# Patient Record
Sex: Female | Born: 1985 | Race: White | Hispanic: No | Marital: Single | Smoking: Never smoker
Health system: Southern US, Community
[De-identification: ages and names within clinical notes are randomized; demographics above are authoritative.]

## PROBLEM LIST (undated history)

## (undated) ENCOUNTER — Inpatient Hospital Stay (HOSPITAL_COMMUNITY): Payer: Self-pay

## (undated) DIAGNOSIS — K219 Gastro-esophageal reflux disease without esophagitis: Secondary | ICD-10-CM

## (undated) DIAGNOSIS — K259 Gastric ulcer, unspecified as acute or chronic, without hemorrhage or perforation: Secondary | ICD-10-CM

## (undated) HISTORY — PX: CYST REMOVAL TRUNK: SHX6283

---
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
## 2005-05-13 ENCOUNTER — Emergency Department (HOSPITAL_COMMUNITY): Admission: EM | Admit: 2005-05-13 | Discharge: 2005-05-14 | Payer: Self-pay | Admitting: Emergency Medicine

## 2006-12-26 IMAGING — CR DG FINGER THUMB 2+V*R*
3 series · 3 of 3 positions shown · non-contrast
Comparison: none

CLINICAL DATA: Infected right thumb. 
 RIGHT THUMB - 3 VIEWS: 
 Three views of the right thumb show diffuse soft tissue swelling.  No fracture is seen and no erosive bony change is evident.  Alignment is normal.

[x finger pa right]
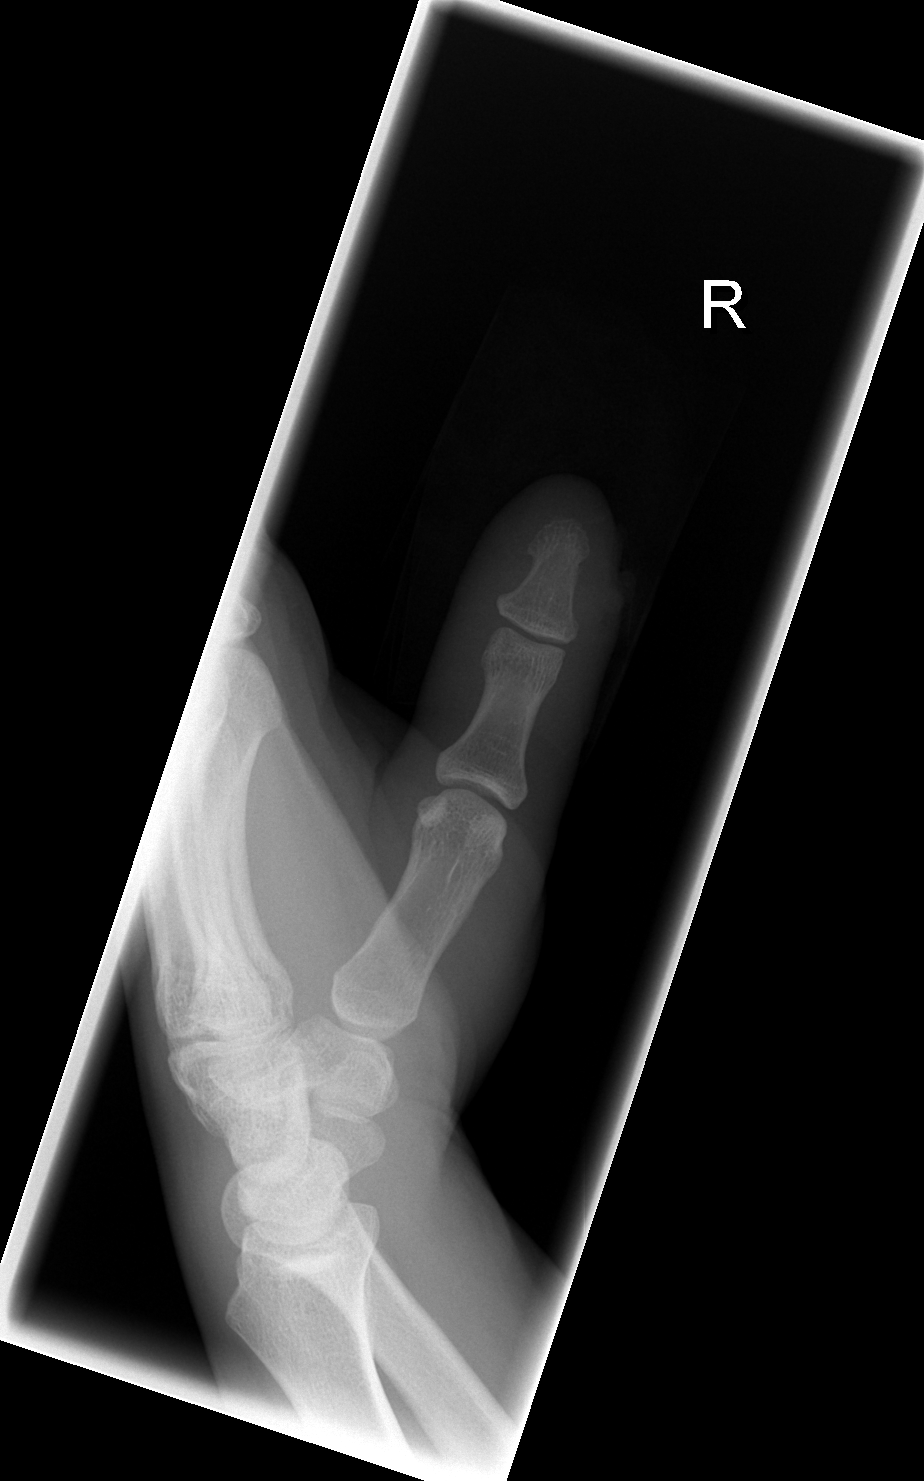

[x finger obl. right]
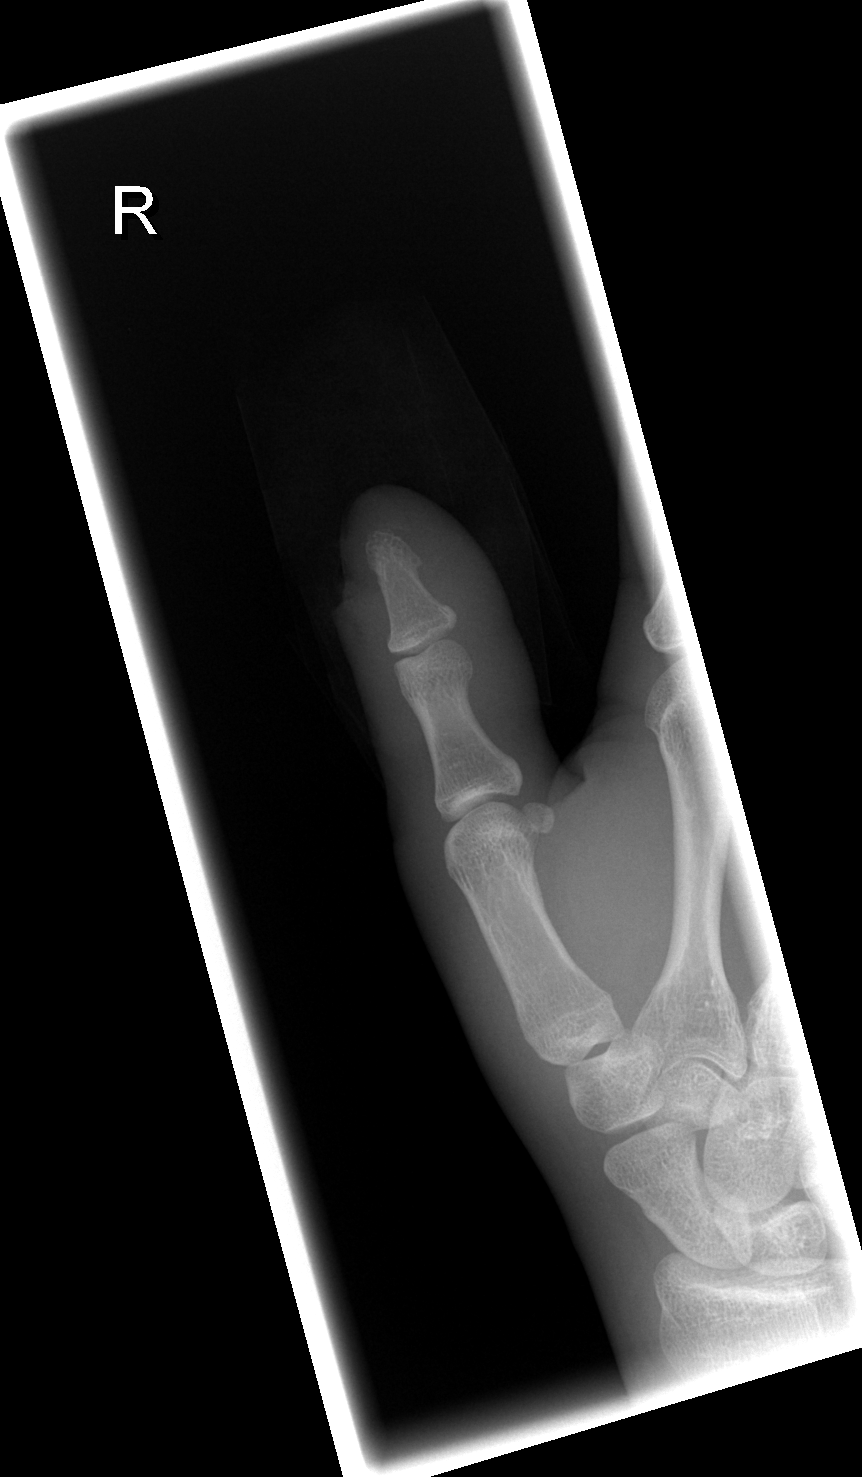

[x finger lateral right]
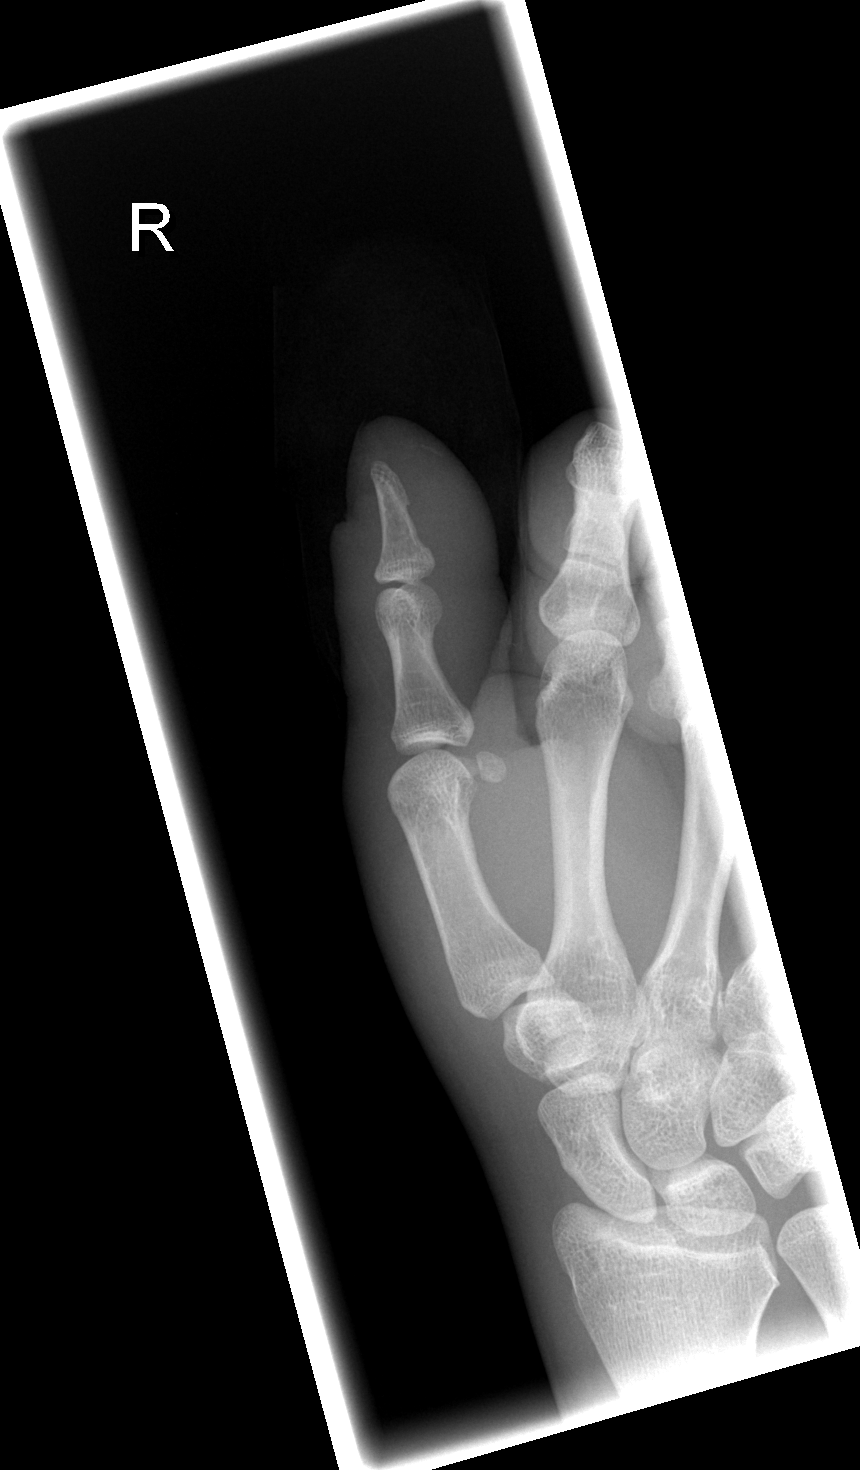

[3 of 3 positions shown; findings below may reference images not displayed]

IMPRESSION: No acute bony abnormality.  Diffuse soft tissue swelling.

## 2007-05-12 ENCOUNTER — Emergency Department (HOSPITAL_COMMUNITY): Admission: EM | Admit: 2007-05-12 | Discharge: 2007-05-12 | Payer: Self-pay | Admitting: Emergency Medicine

## 2008-12-24 IMAGING — CR DG HAND COMPLETE 3+V*R*
3 series · 3 of 3 positions shown · non-contrast
Comparison: None.

CLINICAL DATA: Laceration, pain. 
 RIGHT HAND ? 3 VIEW:

[x hand pa right]
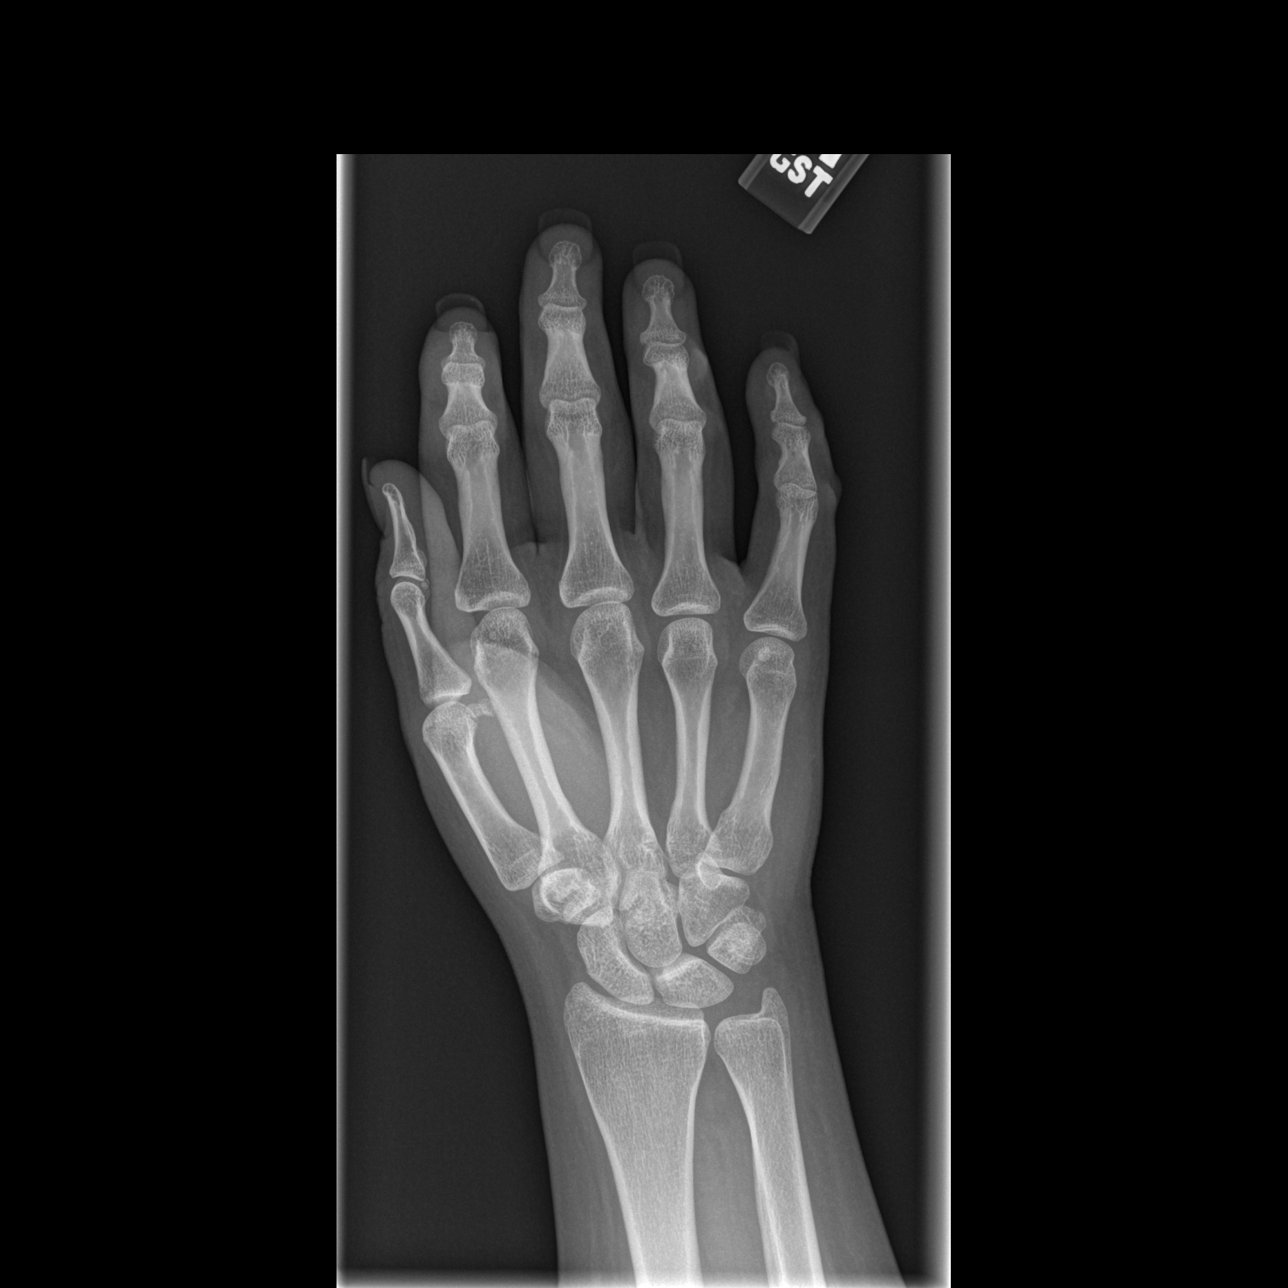

[x hand oblique right]
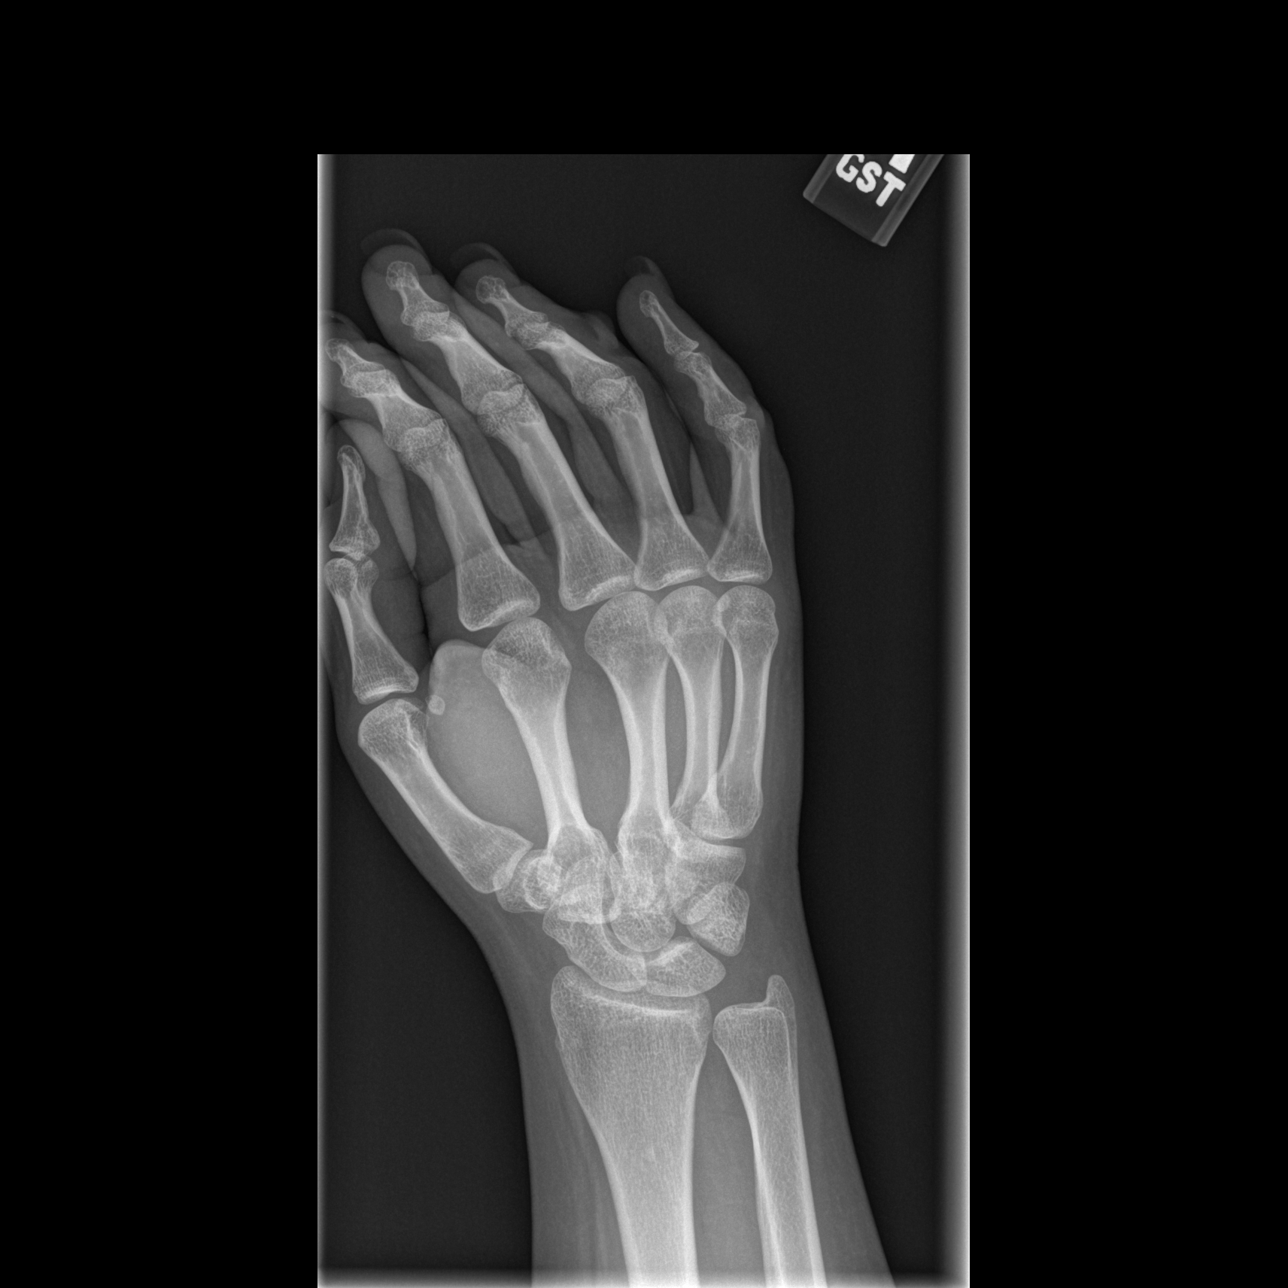

[x hand lat right]
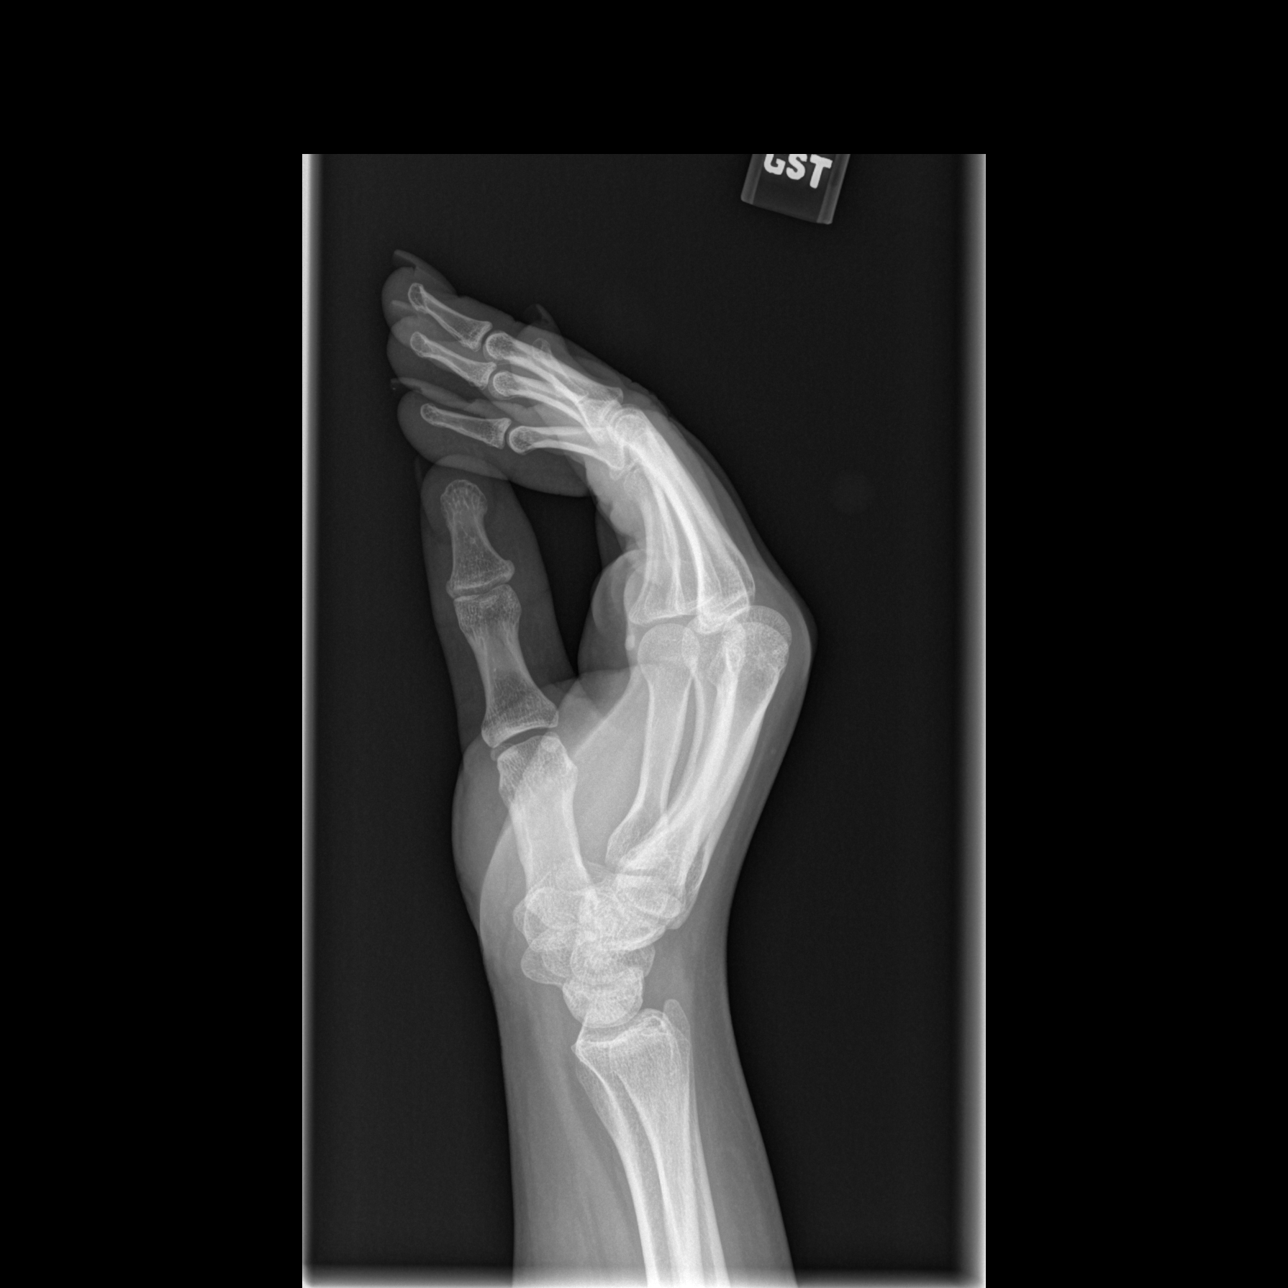

[3 of 3 positions shown; findings below may reference images not displayed]

FINDINGS: Soft tissue defects are seen at the proximal DIP joint of the ring finger and PIP joint of the little finger, compatible with laceration.  No foreign body.  No fracture or dislocation.
IMPRESSION: Laceration of ring and little fingers without fracture or foreign body.

## 2010-08-25 NOTE — Procedures (Signed)
[facility]  Patient:    Wendy Blevins, Wendy Blevins Visit Number: [id number] MRN: [id number]          Service Type: Attending:  Verlin Grills, M.D. Dictated by:   Verlin Grills, M.D. Proc. Date: 04/26/01   CC:         Anastasio Auerbach, M.D.   Procedure Report  PROCEDURE:  Esophagogastroduodenoscopy.  REFERRING PHYSICIAN:  Anastasio Auerbach, M.D.  INDICATIONS FOR PROCEDURE:  The patient is a 25 year old female who is admitted to the hospital having accidentally ingested hy-speed which is a caustic cleaner.  ENDOSCOPIST:  Verlin Grills, M.D.  PREMEDICATION:  Versed 10 mg and Demerol 50 mg.  ENDOSCOPE:  Olympus gastroscope.  DESCRIPTION OF PROCEDURE:  After discussing the procedure with the patient, she was placed in the left lateral decubitus position.  I administered intravenous Demerol and intravenous Versed to achieve conscious sedation for the procedure.  The patients blood pressure, oxygen saturation, and cardiac rhythm were monitored throughout the procedure, and documented in the medical record.  The Olympus gastroscope was easily passed through the posterior hypopharynx into the proximal esophagus without difficulty.  The hypopharynx, larynx, and vocal cords appeared normal.  Esophagoscopy:  The proximal and mid segments of the esophagus appeared normal.  There is circumferential erosive esophagitis, localized to the esophagogastric junction.  Gastroscopy:  There is generalized caustic injury to the entire stomach lining; the most severe injury involves the gastric body and antrum.  There is generalized mucosal erythema, and edema with generalized ulceration involving the proximal antrum.  The pylorus appeared normal.  Duodenoscopy:  The duodenal bulb and descending duodenum appears normal.  ASSESSMENT:  Generalized gastric mucosal injury secondary to accidental ingestion of hy-speed caustic cleanser.  There is generalized mucosal  erythema indicative of generalized mucosal erosion with mucosal edema and ulceration of the proximal gastric antrum. Dictated by:   Verlin Grills, M.D. Attending:  Verlin Grills, M.D. DD:  04/26/01 TD:  04/28/01 Job: [id number]

## 2010-08-25 NOTE — H&P (Signed)
[facility]  Patient:    Wendy Blevins, Wendy Blevins Visit Number: [id number] MRN: [id number]          Service Type: MED Location: [id number] Attending Physician:  Anastasio Auerbach Dictated by:   Rosanne Sack, M.D. Admit Date:  04/24/2001   CC:         Daine Floras, M.D.   History and Physical  DATE OF BIRTH:  February 21, 1986  PROBLEM LIST: 1. Questionable ingestion of cleaning product while under alcohol    intoxication, known suicidal situation. 2. Alcohol intoxication. 3. Hypokalemia. 4. Depression.  CHIEF COMPLAINT:  Questionable ingestion of cleaning product and alcohol intoxication.  HISTORY OF PRESENT ILLNESS:  Wendy Blevins is a very pleasant 25 year old female brought by her friends to the emergency department.  The patient was drinking vodka for fun with her friends this past night when the patient was found with a bottle of Hy-Speed opened.  It was unclear if the patient tried to put some of this cleaning product in her mouth.  The patient, at this point denies any ingestion of this product.  About two days ago, Wendy Blevins moved out of her parents house after having a family argument.  The patient was with her boyfriend.  She has chronic depression, though this problem has been quite stable.  The patient denies any suicidal ideation.  She denies insomnia, decreased appetite, or decreased social interest.  Currently, Wendy Blevins denies chest pain, mouth soreness, or burning sensation.  No abdominal symptoms.  On arrival to the emergency department, the emergency room physician contacted the poison control, who recommended IV fluids and milk of magnesia.  No activated charcoal was given.  The patient denies chest pain, shortness of breath, nausea, vomiting, diarrhea, constipation, headache, syncope, cough, urinary symptoms, melena, tarry stools, bright red blood per rectum, rhinorrhea, focal weakness, swallowing, or lower extremity swelling.   No vaginal discharge.  Her last menstrual period was two weeks ago.  PAST MEDICAL HISTORY:  As in problem list.  ALLERGIES:  None.  MEDICATIONS:  Zoloft 75 mg p.o. q.d.  SOCIAL HISTORY:  The patient is single and lives with her parents.  She does not go to school nor work.  She denies smoking or illegal drug use.  She normally does not drink, except for on sporadic occasions.  FAMILY HISTORY:  Significant for diabetes in her grandmother.  Her mother and grandmother have hypertension.  No strokes in the future.  Her grandmother had a heart attack.  The maternal grandmother had melanoma.  REVIEW OF SYSTEMS:  As in HPI.  PHYSICAL EXAMINATION:  VITAL SIGNS:  Temperature 97.4, blood pressure 131/81, initial heart rate 112, currently 82, respiratory rate 20.  HEENT:  Normocephalic, atraumatic.  Nonicteric sclerae.  Conjunctivae within the normal limits.  PERRLA, EOMI.  Funduscopic exam negative for papilledema, hemorrhages.  TMs within the normal limits.  Slight dry mucous membranes. There is no evidence of focal mucosal irritation or ulcerations.  Oropharynx is clear.  NECK:  Supple.  No JVD, no bruits, no adenopathy, no thyromegaly.  LUNGS:  Clear to auscultation bilaterally.  Without crackles, wheezes.  Fair air movement bilaterally.  CARDIAC:  Irregular rate and rhythm.  Without murmurs, rubs, or gallops. Normal S1, S2.  ABDOMEN:  Nontender, nondistended.  Bowel sounds were present.  No hepatosplenomegaly.  No rebound, no guarding, no masses, no bruits.  GENITOURINARY:  Within the normal limits.  RECTAL:  Not done.  BREASTS:  Within the normal limits.  EXTREMITIES:  No edema, clubbing, or cyanosis.  Pulses 2+ bilaterally.  NEUROLOGIC:  Alert and oriented x3.  Cranial nerves II-XII intact.  Sensorium intact.  Strength 5/5 in all extremities.  DTRs 3/5 in all extremities. Plantar reflexes downgoing bilaterally.  LABORATORY DATA:  Urine pregnancy test negative.   Negative Tylenol level. Negative aspirin level.  Urine drug screen negative.  Alcohol level 254. Sodium 139, potassium 3.3, chloride 108, CO2 24, BUN 4, creatinine 0.6, glucose 99.  LFTs within the normal limits.  Hemoglobin 14.2, MCV 81, WBC 7.0, no left shift, platelets 326.  ASSESSMENT AND PLAN: 1. Questionable ingestion of cleaning product:  The cleaning product in    question is Hy-Speed, which has hydrogen chloride, para-di-isobutyl    phenoxyethoxyethyldimithylbenzyl, and, finally, ammonia chloride.  This    chemical is quite corrosive in human tissue.  Normally associated with    severe irritation and possible ulcerations of the surface it normally gets    in contact with.  Currently, the physical exam does not support any    ingestion whatsoever of this chemical agent.  The patient denies sore    mouth, chest pain, or abdominal or epigastric upset.  She cannot remember    specifically if she ended up getting any drop of this product in the mouth    because she was under alcohol effect/intoxication.  For now, our plan is to    observe this patient for several hours in the hospital.  The patient    received milk of magnesia in the ER.  No activated charcoal was given.  At    this point, there is no evidence of suicidal ideation nor uncontrolled or    unstable depression.  Once the patient is medically clear, she will be    discharged home with her parents, and she will be followed by Dr. Betti Cruz,    her psychiatrist.  For now, will provide with supportive care with IV    fluids, milk of magnesia, and Protonix.  There is no need for inpatient    psychiatric evaluation nor GI input at this point. 2. Alcohol intoxication:  I talked to the patient in the presence of her    parents about the importance of not drinking alcohol, especially under the    age of 55.  The patients parents agree, as well as Wendy Blevins.  The patient    does not have any alcohol use or abuse. 3. Hypokalemia:  There  are slight signs of dehydration, likely to be     associated with alcohol intoxication.  IV fluids were started.  Potassium    supplementation was given.  A new serum potassium level will be checked    within six hours. 4. Depression:  This problem seems to be stable, as described in problem #1.    Will continue Zoloft, and the patient will be followed by Dr. Betti Cruz as an    outpatient. Dictated by:   Rosanne Sack, M.D. Attending Physician:  Anastasio Auerbach DD:  04/25/01 TD:  04/25/01 Job: [id number]

## 2010-08-25 NOTE — Consult Note (Signed)
[facility]  Wendy Blevins, Wendy Blevins                ACCOUNT NO.:  [id number]   MEDICAL RECORD NO.:  [id number]          PATIENT TYPE:  EMS   LOCATION:  ED                           FACILITY:  [facility]   PHYSICIAN:  Vanita Panda. Magnus Ivan, M.D.DATE OF BIRTH:  [birth year]   DATE OF CONSULTATION:  05/13/2005  DATE OF DISCHARGE:                                   CONSULTATION   REASON FOR CONSULTATION:  Right thumb complicated felon.   CONSULTING PHYSICIAN:  Tinnie Gens P. Caporossi, M.D.   HISTORY OF PRESENT ILLNESS:  Briefly, Wendy Blevins is a 25 year old known drug  user, who says she has an infection of her right thumb going on for the last  two weeks.  She said she got copper caught in this, and it sounds like it  was from a copper crack pipe.  Even in the ER tonight she is trying to sign  out AMA.  When Dr. Weldon Inches consulted me to address the infection, he said  that she had purulence coming out her thumb as well as a black eschar and  red streaks on her arm.  Upon consulting the patient, I told her she needed  to stay for at least a bedside I&D and a dose of IV antibiotics.  I believe  she was given a dose of IV Rocephin.  She told me that she has had staph  infections in the past, so I ordered a dose of 1 g IV vancomycin.   PAST MEDICAL HISTORY:  None.   ALLERGIES:  No known drug allergies.   MEDICATIONS:  None.   SOCIAL HISTORY:  She has two children, who live with her mother, and she  admits to using crack cocaine as much as two days ago.  She also has a  history of alcohol use and tobacco use.   REVIEW OF SYSTEMS:  Negative for chest pain, shortness of breath, fever or  chills, nausea or vomiting.   PHYSICAL EXAMINATION:  VITAL SIGNS:  She is afebrile with stable vital  signs.  GENERAL:  She is an alert and oriented female who is drowsy from, I believe,  obvious drug use today.  EXTREMITIES:  Examination of her right upper extremity shows a large eschar  on the radial aspect of the thumb at  the thumb tip.  There is gross  purulence coming out of this and it does appear to be a complicated felon.  She has normal sensation of her tip.  There is no evidence of flexor tendon  synovitis.  She flexes and extends the thumb easily.  She has dorsal  erythema as well.  She has palpable pulses in her wrist.  There is no  evidence of infection in the deep spaces of her palm or at her wrist.  She  does have some red streaking in her arm.   IMPRESSION:  This is a 25 year old with a complicated right thumb felon and  obvious increasing infection.   PLAN:  I was able to prep her hand with Betadine and alcohol at the bedside.  I then applied a plain 0.25%  Sensorcaine digital block without complication.  I was able to irrigate and debride the eschar at the bedside as well as open  up the thumb deeply.  I encountered gross purulence and then irrigated this  with a solution of Betadine and normal saline, followed by a full liter of  normal saline solution.  I then packed the wound with Xeroform dressing,  followed by sterile soft Kling and Coban.  The patient was given a dose of  IV vancomycin, and I am going to discharge her from the ER on oral  doxycycline as well as Vicodin.  She will follow up in my office tomorrow  for a recheck of her wound.  She was given information for following up in  my office as well.           ______________________________  Vanita Panda. Magnus Ivan, M.D.     CYB/MEDQ  D:  05/14/2005  T:  05/14/2005  Job:  [id number]   cc:   Tinnie Gens P. Weldon Inches, MD  Fax: [phone]

## 2012-10-05 ENCOUNTER — Inpatient Hospital Stay (HOSPITAL_COMMUNITY)
Admission: AD | Admit: 2012-10-05 | Discharge: 2012-10-05 | Disposition: A | Discharge status: Home / Self Care | Payer: Medicaid Other | Source: Ambulatory Visit | Attending: Obstetrics & Gynecology | Admitting: Obstetrics & Gynecology

## 2012-10-05 ENCOUNTER — Encounter (HOSPITAL_COMMUNITY): Payer: Self-pay | Admitting: *Deleted

## 2012-10-05 DIAGNOSIS — R109 Unspecified abdominal pain: Secondary | ICD-10-CM | POA: Insufficient documentation

## 2012-10-05 DIAGNOSIS — N930 Postcoital and contact bleeding: Secondary | ICD-10-CM

## 2012-10-05 DIAGNOSIS — O26859 Spotting complicating pregnancy, unspecified trimester: Secondary | ICD-10-CM | POA: Insufficient documentation

## 2012-10-05 DIAGNOSIS — N949 Unspecified condition associated with female genital organs and menstrual cycle: Secondary | ICD-10-CM | POA: Insufficient documentation

## 2012-10-05 HISTORY — DX: Gastric ulcer, unspecified as acute or chronic, without hemorrhage or perforation: K25.9

## 2012-10-05 HISTORY — DX: Gastro-esophageal reflux disease without esophagitis: K21.9

## 2012-10-05 LAB — URINALYSIS, ROUTINE W REFLEX MICROSCOPIC
Bilirubin Urine: NEGATIVE
Glucose, UA: NEGATIVE mg/dL
Hgb urine dipstick: NEGATIVE
Ketones, ur: NEGATIVE mg/dL
Leukocytes, UA: NEGATIVE
Nitrite: NEGATIVE
Protein, ur: NEGATIVE mg/dL
Specific Gravity, Urine: 1.025 (ref 1.005–1.030)
Urobilinogen, UA: 0.2 mg/dL (ref 0.0–1.0)
pH: 6 (ref 5.0–8.0)

## 2012-10-05 NOTE — MAU Provider Note (Signed)
  History     CSN: [id number]  Arrival date and time: 10/05/12 1220   First Provider Initiated Contact with Patient 10/05/12 1323      Chief Complaint  Patient presents with  . Vaginal Bleeding   HPI Wendy Blevins is a 27 y.o. G2P1001 at [redacted]w[redacted]d who presents with vaginal spotting and some intermittent crampy lower abdominal pain.  Patient reports some spotting early today/this morning.  Patient reports intercourse last night.  Bleeding is light and was noticed with wiping.  She denies contractions but reports some mild intermittent lower abdominal pain which she describes as "crampy."  No loss of fluid. + vaginal discharge but no itching, burning or odor.    Patient has had no PNC thus far.  Past Medical History  Diagnosis Date  . GERD (gastroesophageal reflux disease)   . Stomach ulcer     Past Surgical History  Procedure Laterality Date  . Cesarean section      No family history on file.  History  Substance Use Topics  . Smoking status: Never Smoker   . Smokeless tobacco: Not on file  . Alcohol Use: No    Allergies: Allergies not on file  No prescriptions prior to admission    ROS Per HPI Physical Exam   Blood pressure 133/71, pulse 72, temperature 98.4 F (36.9 C), temperature source Oral, resp. rate 18, height 5\' 4"  (1.626 m), weight 104.146 kg (229 lb 9.6 oz), last menstrual period 03/30/2012.  Physical Exam Gen: well appearing, NAD. Abd: gravid but otherwise soft, nontender to palpation Ext: no appreciable lower extremity edema bilaterally Neuro: no focal deficits. GU: genital warts noted. Pelvic exam: Cervix closed. No bleeding appreciated.  No discharge.  FHR: baseline 135, mod variability, 10x10 accels, no decels Toco: none noted   MAU Course  Procedures  MDM Physical exam (above)  Assessment and Plan  Wendy Blevins is a 27 y.o. G2P1001 at [redacted]w[redacted]d who presents with vaginal spotting and lower abdominal cramping. - No bleeding seen on pelvic  exam.  Cervix closed.  Bleeding is post coital. - No contractions noted on Toco - No PNC - will send message to clinic staff to get patient established. - Will discharge home.   [name] Other 10/05/2012, 1:23 PM   I saw and examined patient and agree with above resident note. I reviewed history, imaging, labs, and vitals. I personally reviewed the fetal heart tracing, and it is reactive. Napoleon Form, MD

## 2012-10-05 NOTE — MAU Note (Signed)
Pt reports she has not had a period since end of December. Reports she had positive pregnancy test in March. Stared having some cramping and bleeding today . Has not started prenatal care.

## 2012-10-08 ENCOUNTER — Ambulatory Visit (HOSPITAL_COMMUNITY)
Admission: RE | Admit: 2012-10-08 | Discharge: 2012-10-08 | Disposition: A | Discharge status: Home / Self Care | Payer: Medicaid Other | Source: Ambulatory Visit | Attending: Obstetrics & Gynecology | Admitting: Obstetrics & Gynecology

## 2012-10-08 DIAGNOSIS — Z3689 Encounter for other specified antenatal screening: Secondary | ICD-10-CM | POA: Insufficient documentation

## 2012-10-08 DIAGNOSIS — N930 Postcoital and contact bleeding: Secondary | ICD-10-CM

## 2012-10-23 ENCOUNTER — Encounter: Payer: Self-pay | Admitting: Obstetrics and Gynecology

## 2012-10-29 ENCOUNTER — Encounter: Payer: Self-pay | Admitting: Family Medicine

## 2012-10-29 ENCOUNTER — Ambulatory Visit (INDEPENDENT_AMBULATORY_CARE_PROVIDER_SITE_OTHER): Payer: Medicaid Other | Admitting: Family Medicine

## 2012-10-29 VITALS — BP 123/81 | Temp 97.4°F | Wt 226.7 lb

## 2012-10-29 DIAGNOSIS — O0933 Supervision of pregnancy with insufficient antenatal care, third trimester: Secondary | ICD-10-CM | POA: Insufficient documentation

## 2012-10-29 DIAGNOSIS — O99613 Diseases of the digestive system complicating pregnancy, third trimester: Secondary | ICD-10-CM

## 2012-10-29 DIAGNOSIS — O34219 Maternal care for unspecified type scar from previous cesarean delivery: Secondary | ICD-10-CM

## 2012-10-29 DIAGNOSIS — O0930 Supervision of pregnancy with insufficient antenatal care, unspecified trimester: Secondary | ICD-10-CM

## 2012-10-29 LAB — POCT URINALYSIS DIP (DEVICE)
Hgb urine dipstick: NEGATIVE
Ketones, ur: NEGATIVE mg/dL
Leukocytes, UA: NEGATIVE
Protein, ur: NEGATIVE mg/dL
Specific Gravity, Urine: <=1.005 (ref 1.005–1.030)
pH: 5.5 (ref 5.0–8.0)

## 2012-10-29 MED ORDER — FAMOTIDINE 20 MG PO TABS: 20.0000 mg | ORAL_TABLET | Freq: Two times a day (BID) | ORAL | Status: AC

## 2012-10-29 NOTE — Progress Notes (Signed)
P = 64    Pt prefers to have 1hr GTT on 7/25.

## 2012-10-29 NOTE — Patient Instructions (Addendum)
Pregnancy - Third Trimester The third trimester of pregnancy (the last 3 months) is a period of the most rapid growth for you and your baby. The baby approaches a length of 20 inches and a weight of 6 to 10 pounds. The baby is adding on fat and getting ready for life outside your body. While inside, babies have periods of sleeping and waking, sucking thumbs, and hiccuping. You can often feel small contractions of the uterus. This is false labor. It is also called Braxton-Hicks contractions. This is like a practice for labor. The usual problems in this stage of pregnancy include more difficulty breathing, swelling of the hands and feet from water retention, and having to urinate more often because of the uterus and baby pressing on your bladder.  PRENATAL EXAMS  Blood work may continue to be done during prenatal exams. These tests are done to check on your health and the probable health of your baby. Blood work is used to follow your blood levels (hemoglobin). Anemia (low hemoglobin) is common during pregnancy. Iron and vitamins are given to help prevent this. You may also continue to be checked for diabetes. Some of the past blood tests may be done again.  The size of the uterus is measured during each visit. This makes sure your baby is growing properly according to your pregnancy dates.  Your blood pressure is checked every prenatal visit. This is to make sure you are not getting toxemia.  Your urine is checked every prenatal visit for infection, diabetes, and protein.  Your weight is checked at each visit. This is done to make sure gains are happening at the suggested rate and that you and your baby are growing normally.  Sometimes, an ultrasound is performed to confirm the position and the proper growth and development of the baby. This is a test done that bounces harmless sound waves off the baby so your caregiver can more accurately determine a due date.  Discuss the type of pain medicine and  anesthesia you will have during your labor and delivery.  Discuss the possibility and anesthesia if a cesarean section might be necessary.  Inform your caregiver if there is any mental or physical violence at home. Sometimes, a specialized non-stress test, contraction stress test, and biophysical profile are done to make sure the baby is not having a problem. Checking the amniotic fluid surrounding the baby is called an amniocentesis. The amniotic fluid is removed by sticking a needle into the belly (abdomen). This is sometimes done near the end of pregnancy if an early delivery is required. In this case, it is done to help make sure the baby's lungs are mature enough for the baby to live outside of the womb. If the lungs are not mature and it is unsafe to deliver the baby, an injection of cortisone medicine is given to the mother 1 to 2 days before the delivery. This helps the baby's lungs mature and makes it safer to deliver the baby. CHANGES OCCURING IN THE THIRD TRIMESTER OF PREGNANCY Your body goes through many changes during pregnancy. They vary from person to person. Talk to your caregiver about changes you notice and are concerned about.  During the last trimester, you have probably had an increase in your appetite. It is normal to have cravings for certain foods. This varies from person to person and pregnancy to pregnancy.  You may begin to get stretch marks on your hips, abdomen, and breasts. These are normal changes in the body   during pregnancy. There are no exercises or medicines to take which prevent this change.  Constipation may be treated with a stool softener or adding bulk to your diet. Drinking lots of fluids, fiber in vegetables, fruits, and whole grains are helpful.  Exercising is also helpful. If you have been very active up until your pregnancy, most of these activities can be continued during your pregnancy. If you have been less active, it is helpful to start an exercise  program such as walking. Consult your caregiver before starting exercise programs.  Avoid all smoking, alcohol, non-prescribed drugs, herbs and "street drugs" during your pregnancy. These chemicals affect the formation and growth of the baby. Avoid chemicals throughout the pregnancy to ensure the delivery of a healthy infant.  Backache, varicose veins, and hemorrhoids may develop or get worse.  You will tire more easily in the third trimester, which is normal.  The baby's movements may be stronger and more often.  You may become short of breath easily.  Your belly button may stick out.  A yellow discharge may leak from your breasts called colostrum.  You may have a bloody mucus discharge. This usually occurs a few days to a week before labor begins. HOME CARE INSTRUCTIONS   Keep your caregiver's appointments. Follow your caregiver's instructions regarding medicine use, exercise, and diet.  During pregnancy, you are providing food for you and your baby. Continue to eat regular, well-balanced meals. Choose foods such as meat, fish, milk and other low fat dairy products, vegetables, fruits, and whole-grain breads and cereals. Your caregiver will tell you of the ideal weight gain.  A physical sexual relationship may be continued throughout pregnancy if there are no other problems such as early (premature) leaking of amniotic fluid from the membranes, vaginal bleeding, or belly (abdominal) pain.  Exercise regularly if there are no restrictions. Check with your caregiver if you are unsure of the safety of your exercises. Greater weight gain will occur in the last 2 trimesters of pregnancy. Exercising helps:  Control your weight.  Get you in shape for labor and delivery.  You lose weight after you deliver.  Rest a lot with legs elevated, or as needed for leg cramps or low back pain.  Wear a good support or jogging bra for breast tenderness during pregnancy. This may help if worn during  sleep. Pads or tissues may be used in the bra if you are leaking colostrum.  Do not use hot tubs, steam rooms, or saunas.  Wear your seat belt when driving. This protects you and your baby if you are in an accident.  Avoid raw meat, cat litter boxes and soil used by cats. These carry germs that can cause birth defects in the baby.  It is easier to leak urine during pregnancy. Tightening up and strengthening the pelvic muscles will help with this problem. You can practice stopping your urination while you are going to the bathroom. These are the same muscles you need to strengthen. It is also the muscles you would use if you were trying to stop from passing gas. You can practice tightening these muscles up 10 times a set and repeating this about 3 times per day. Once you know what muscles to tighten up, do not perform these exercises during urination. It is more likely to cause an infection by backing up the urine.  Ask for help if you have financial, counseling, or nutritional needs during pregnancy. Your caregiver will be able to offer counseling for these   needs as well as refer you for other special needs.  Make a list of emergency phone numbers and have them available.  Plan on getting help from family or friends when you go home from the hospital.  Make a trial run to the hospital.  Take prenatal classes with the father to understand, practice, and ask questions about the labor and delivery.  Prepare the baby's room or nursery.  Do not travel out of the city unless it is absolutely necessary and with the advice of your caregiver.  Wear only low or no heal shoes to have better balance and prevent falling. MEDICINES AND DRUG USE IN PREGNANCY  Take prenatal vitamins as directed. The vitamin should contain 1 milligram of folic acid. Keep all vitamins out of reach of children. Only a couple vitamins or tablets containing iron may be fatal to a baby or young child when ingested.  Avoid use  of all medicines, including herbs, over-the-counter medicines, not prescribed or suggested by your caregiver. Only take over-the-counter or prescription medicines for pain, discomfort, or fever as directed by your caregiver. Do not use aspirin, ibuprofen or naproxen unless approved by your caregiver.  Let your caregiver also know about herbs you may be using.  Alcohol is related to a number of birth defects. This includes fetal alcohol syndrome. All alcohol, in any form, should be avoided completely. Smoking will cause low birth rate and premature babies.  Illegal drugs are very harmful to the baby. They are absolutely forbidden. A baby born to an addicted mother will be addicted at birth. The baby will go through the same withdrawal an adult does. SEEK MEDICAL CARE IF: You have any concerns or worries during your pregnancy. It is better to call with your questions if you feel they cannot wait, rather than worry about them. SEEK IMMEDIATE MEDICAL CARE IF:   An unexplained oral temperature above 102 F (38.9 C) develops, or as your caregiver suggests.  You have leaking of fluid from the vagina. If leaking membranes are suspected, take your temperature and tell your caregiver of this when you call.  There is vaginal spotting, bleeding or passing clots. Tell your caregiver of the amount and how many pads are used.  You develop a bad smelling vaginal discharge with a change in the color from clear to white.  You develop vomiting that lasts more than 24 hours.  You develop chills or fever.  You develop shortness of breath.  You develop burning on urination.  You loose more than 2 pounds of weight or gain more than 2 pounds of weight or as suggested by your caregiver.  You notice sudden swelling of your face, hands, and feet or legs.  You develop belly (abdominal) pain. Round ligament discomfort is a common non-cancerous (benign) cause of abdominal pain in pregnancy. Your caregiver still  must evaluate you.  You develop a severe headache that does not go away.  You develop visual problems, blurred or double vision.  If you have not felt your baby move for more than 1 hour. If you think the baby is not moving as much as usual, eat something with sugar in it and lie down on your left side for an hour. The baby should move at least 4 to 5 times per hour. Call right away if your baby moves less than that.  You fall, are in a car accident, or any kind of trauma.  There is mental or physical violence at home. Document Released: 03/20/2001   Document Revised: 12/19/2011 Document Reviewed: 09/22/2008 [facility] Patient Information 2014 [place], [place].   Vaginal Birth After Cesarean Delivery Vaginal birth after Cesarean delivery (VBAC) is giving birth vaginally after previously delivering a baby by a cesarean. In the past, if a woman had a Cesarean delivery, all births afterwards would be done by Cesarean delivery. This is no longer true. It can be safe for the mother to try a vaginal delivery after having a Cesarean. The final decision to have a VBAC or repeat Cesarean delivery should be between the patient and her caregiver. The risks and benefits can be discussed relative to the reason for, and the type of the previous Cesarean delivery. WOMEN WHO PLAN TO HAVE A VBAC SHOULD CHECK WITH THEIR DOCTOR TO BE SURE THAT:  The previous Cesarean was done with a low transverse uterine incision (not a vertical classical incision).  The birth canal is big enough for the baby.  There were no other operations on the uterus.  They will have an electronic fetal monitor (EFM) on at all times during labor.  An operating room would be available and ready in case an emergency Cesarean is needed.  A doctor and surgical nursing staff would be available at all times during labor to be ready to do an emergency Cesarean if necessary.  An anesthesiologist would be present in case an emergency Cesarean is  needed.  The nursery is prepared and has adequate personnel and necessary equipment available to care for the baby in case of an emergency Cesarean. BENEFITS OF VBAC:  Shorter stay in the hospital.  Lower delivery, nursery and hospital costs.  Less blood loss and need for blood transfusions.  Less fever and discomfort from major surgery.  Lower risk of blood clots.  Lower risk of infection.  Shorter recovery after going home.  Lower risk of other surgical complications, such as opening of the incision or hernia in the incision.  Decreased risk of injury to other organs.  Decreased risk for having to remove the uterus (hysterectomy).  Decreased risk for the placenta to completely or partially cover the opening of the uterus (placenta previa) with a future pregnancy.  Ability to have a larger family if desired. RISKS OF A VBAC:  Rupture of the uterus.  Having to remove the uterus (hysterectomy) if it ruptures.  All the complications of major surgery and/or injury to other organs.  Excessive bleeding, blood clots and infection.  Lower Apgar scores (method to evaluate the newborn based on appearance, pulse, grimace, activity, and respiration) and more risks to the baby.  There is a higher risk of uterine rupture if you induce or augment labor.  There is a higher risk of uterine rupture if you use medications to ripen the cervix. VBAC SHOULD NOT BE DONE IF:  The previous Cesarean was done with a vertical (classical) or T-shaped incision, or you do not know what kind of an incision was made.  You had a ruptured uterus.  You had surgery on your uterus.  You have medical or obstetrical problems.  There are problems with the baby.  There were two previous Cesarean deliveries and no vaginal deliveries. OTHER FACTS TO KNOW ABOUT VBAC:  It is safe to have an epidural anesthetic with VBAC.  It is safe to turn the baby from a breech position (attempt an external cephalic  version).  It is safe to try a VBAC with twins.  Pregnancies later than 40 weeks have not been successful with VBAC.  There  is an increased failure rate of a VBAC in obese pregnant women.  There is an increased failure rate with VABC if the baby weighs 8.8 pounds (4000 grams) or more.  There is an increased failure rate if the time between the Cesarean and VBAC is less than 19 months.  There is an increased failure rate if pre-eclampsia is present (high blood pressure, protein in the urine and swelling of face and extremities).  VBAC is very successful if there was a previous vaginal birth.  VBAC is very successful when the labor starts spontaneously before the due date.  Delivery of VBAC is similar to having a normal spontaneous vaginal delivery. It is important to discuss VBAC with your caregiver early in the pregnancy so you can understand the risks, benefits and options. It will give you time to decide what is best in your particular case relevant to the reason for your previous Cesarean delivery. It should be understood that medical changes in the mother or pregnancy may occur during the pregnancy, which make it necessary to change you or your caregiver's initial decision. The counseling, concerns and decisions should be documented in the medical record and signed by all parties. Document Released: 09/16/2006 Document Revised: 06/18/2011 Document Reviewed: 05/07/2008 [facility] Patient Information 2014 [place], [place].

## 2012-10-29 NOTE — Progress Notes (Signed)
  Subjective:    Wendy Blevins is being seen today for her first obstetrical visit.  This is a planned pregnancy. She is at [redacted]w[redacted]d gestation. Her obstetrical history is significant for cesarean section for failed induction. Relationship with FOB: significant other, living together. Patient does intend to breast feed. Pregnancy history fully reviewed.  Had c-section with first baby due to failure to dilate beyond 1 cm. Was induced for post-dates, pitocin only per patient, for 12 hours and dilated to only 1 cm. Baby weighed 6 lb 11 oz.  This was 9 yrs ago. Is undecided regarding TOLAC vs repeat c-section.   Last pap a while ago (around 2006). History of abnormal pap with biopsy followed by cryotherapy. No follow-up. No history of STDs. No history of HSV or known exposure to HSV.   Denies nausea, vomiting, diarrhea, constipation, vaginal discharge, bleeding, fluid loss or contractions. Baby moving normally. Does have severe heartburn occasionally.  Menstrual History: OB History   Grav Para Term Preterm Abortions TAB SAB Ect Mult Living   2 1 1       1       Patient's last menstrual period was 03/30/2012.    The following portions of the patient's history were reviewed and updated as appropriate: allergies, current medications, past family history, past medical history, past social history, past surgical history and problem list.  Review of Systems Pertinent items are noted in HPI.    Objective:   GEN:  WNWD, no distress HEENT:  NCAT, EOMI, conjunctiva clear NECK:  Supple, non-tender, no thyromegaly, trachea midline CV: RRR, no murmur RESP:  CTAB ABD:  Soft, non-tender, no guarding or rebound, normal bowel sounds EXTREM:  Warm, well perfused, no edema or tenderness NEURO:  Alert, oriented, no focal deficits GU:  Normal external genitalia, normal vagina, normal cervix. Minimal discharge. Some spotting with pap. No CMT or adnexal tenderness.   Assessment:    Pregnancy at 28 and 4/7  weeks  Previous cesarean section Desires permanent sterilization   Plan:    Initial labs drawn. Prenatal vitamins, Pepcid Problem list reviewed and updated. Considering TOLAC - information provided Desires BTL - Medicaid pending. Sign papers when Medicaid card available. AFP3 discussed: too late. Role of ultrasound in pregnancy discussed; fetal survey: results reviewed - normal Amniocentesis discussed: not indicated. Follow up in 2 weeks. 50% of 45 min visit spent on counseling and coordination of care.

## 2012-10-30 LAB — OBSTETRIC PANEL
Antibody Screen: NEGATIVE
Basophils Absolute: 0 10*3/uL (ref 0.0–0.1)
Basophils Relative: 0 % (ref 0–1)
Eosinophils Absolute: 0 10*3/uL (ref 0.0–0.7)
Eosinophils Relative: 0 % (ref 0–5)
HCT: 37.1 % (ref 36.0–46.0)
MCH: 28.5 pg (ref 26.0–34.0)
MCHC: 34.5 g/dL (ref 30.0–36.0)
MCV: 82.6 fL (ref 78.0–100.0)
Monocytes Absolute: 0.5 10*3/uL (ref 0.1–1.0)
RDW: 14.8 % (ref 11.5–15.5)
Rh Type: POSITIVE

## 2012-10-30 LAB — HIV ANTIBODY (ROUTINE TESTING W REFLEX): HIV: NONREACTIVE

## 2012-10-31 ENCOUNTER — Other Ambulatory Visit: Payer: Self-pay

## 2012-10-31 DIAGNOSIS — O0933 Supervision of pregnancy with insufficient antenatal care, third trimester: Secondary | ICD-10-CM

## 2012-11-03 ENCOUNTER — Encounter: Payer: Self-pay | Admitting: Family Medicine

## 2012-11-14 ENCOUNTER — Encounter: Payer: Self-pay | Admitting: Advanced Practice Midwife

## 2012-11-25 ENCOUNTER — Inpatient Hospital Stay (HOSPITAL_COMMUNITY)
Admission: AD | Admit: 2012-11-25 | Discharge: 2012-11-25 | Disposition: A | Discharge status: Home / Self Care | Payer: Medicaid Other | Source: Ambulatory Visit | Attending: Obstetrics & Gynecology | Admitting: Obstetrics & Gynecology

## 2012-11-25 ENCOUNTER — Encounter (HOSPITAL_COMMUNITY): Payer: Self-pay | Admitting: *Deleted

## 2012-11-25 ENCOUNTER — Encounter: Payer: Self-pay | Admitting: Family Medicine

## 2012-11-25 ENCOUNTER — Ambulatory Visit (INDEPENDENT_AMBULATORY_CARE_PROVIDER_SITE_OTHER): Payer: Medicaid Other | Admitting: Family Medicine

## 2012-11-25 VITALS — BP 137/90 | Temp 97.0°F | Wt 227.2 lb

## 2012-11-25 DIAGNOSIS — Z3482 Encounter for supervision of other normal pregnancy, second trimester: Secondary | ICD-10-CM

## 2012-11-25 DIAGNOSIS — Z23 Encounter for immunization: Secondary | ICD-10-CM

## 2012-11-25 DIAGNOSIS — O0933 Supervision of pregnancy with insufficient antenatal care, third trimester: Secondary | ICD-10-CM

## 2012-11-25 DIAGNOSIS — O139 Gestational [pregnancy-induced] hypertension without significant proteinuria, unspecified trimester: Secondary | ICD-10-CM | POA: Insufficient documentation

## 2012-11-25 DIAGNOSIS — O0930 Supervision of pregnancy with insufficient antenatal care, unspecified trimester: Secondary | ICD-10-CM

## 2012-11-25 DIAGNOSIS — O133 Gestational [pregnancy-induced] hypertension without significant proteinuria, third trimester: Secondary | ICD-10-CM

## 2012-11-25 LAB — CBC
HCT: 36.3 % (ref 36.0–46.0)
MCH: 28 pg (ref 26.0–34.0)
MCV: 82.5 fL (ref 78.0–100.0)
Platelets: 210 10*3/uL (ref 150–400)
RBC: 4.4 MIL/uL (ref 3.87–5.11)
WBC: 10.3 10*3/uL (ref 4.0–10.5)

## 2012-11-25 LAB — COMPREHENSIVE METABOLIC PANEL
AST: 18 U/L (ref 0–37)
Albumin: 2.8 g/dL — ABNORMAL LOW (ref 3.5–5.2)
Alkaline Phosphatase: 103 U/L (ref 39–117)
Chloride: 105 mEq/L (ref 96–112)
Creatinine, Ser: 0.59 mg/dL (ref 0.50–1.10)
Potassium: 4.1 mEq/L (ref 3.5–5.1)
Total Bilirubin: 0.4 mg/dL (ref 0.3–1.2)
Total Protein: 5.8 g/dL — ABNORMAL LOW (ref 6.0–8.3)

## 2012-11-25 LAB — POCT URINALYSIS DIP (DEVICE)
Bilirubin Urine: NEGATIVE
Glucose, UA: NEGATIVE mg/dL
Leukocytes, UA: NEGATIVE
Nitrite: NEGATIVE
Urobilinogen, UA: 0.2 mg/dL (ref 0.0–1.0)

## 2012-11-25 LAB — PROTEIN / CREATININE RATIO, URINE: Protein Creatinine Ratio: 0.12 (ref 0.00–0.15)

## 2012-11-25 MED ORDER — TETANUS-DIPHTH-ACELL PERTUSSIS 5-2.5-18.5 LF-MCG/0.5 IM SUSP
0.5000 mL | Freq: Once | INTRAMUSCULAR | Status: AC
  Administered 2012-11-25: 0.5 mL via INTRAMUSCULAR

## 2012-11-25 NOTE — Patient Instructions (Signed)

## 2012-11-25 NOTE — Progress Notes (Signed)
Elevated BP on repeat Diastolic in mild range with headaches. Will send to MAU for lab evaluation and monitoring.

## 2012-11-25 NOTE — MAU Provider Note (Signed)
Attestation of Attending Supervision of Advanced Practitioner (PA/CNM/NP): Evaluation and management procedures were performed by the Advanced Practitioner under my supervision and collaboration.  I have reviewed the Advanced Practitioner's note and chart, and I agree with the management and plan.  Garrick Midgley, MD, FACOG Attending Obstetrician & Gynecologist Faculty Practice, [facility]  

## 2012-11-25 NOTE — MAU Note (Signed)
Pt sent from clinic to monitor BP

## 2012-11-25 NOTE — Progress Notes (Signed)
Dr. at bedside examining patient.

## 2012-11-25 NOTE — Progress Notes (Signed)
Pulse- 73   Edema-feet/ankles

## 2012-11-25 NOTE — MAU Provider Note (Signed)
History     CSN: [id number]  Arrival date and time: 11/25/12 1500   None     Chief Complaint  Patient presents with  . Hypertension   HPI Comments: Ms. Sharpless is coming from clinic because of two readings with diastolic blood pressure >90 (90 and 94). She has had a bilateral headache for two weeks which only occurs in the morning when she wakes up. She has taken no medication. She has had no vision changes, no abdominal pain, no facial or arm swelling. She is not having contractions and reports good fetal movement (multiple times per hour). ROS negative for chest pain, shortness of breath, nausea/vomiting, constipation/diarrhea.   Past Medical History  Diagnosis Date  . Stomach ulcer   . GERD (gastroesophageal reflux disease)     Past Surgical History  Procedure Laterality Date  . Cesarean section  2005    failure to progress  . Cyst removal trunk      Family History  Problem Relation Age of Onset  . Diabetes Maternal Grandmother     History  Substance Use Topics  . Smoking status: Never Smoker   . Smokeless tobacco: Not on file  . Alcohol Use: No     Comment: occasional prior to pregnancy    Allergies: No Known Allergies  Prescriptions prior to admission  Medication Sig Dispense Refill  . famotidine (PEPCID) 20 MG tablet Take 1 tablet (20 mg total) by mouth 2 (two) times daily.  30 tablet  3  . Prenatal Vit-Fe Fumarate-FA (PRENATAL MULTIVITAMIN) TABS Take 1 tablet by mouth daily at 12 noon.        Review of Systems  Constitutional: Negative for fever.  Respiratory: Negative for shortness of breath.   Cardiovascular: Positive for leg swelling. Negative for chest pain.  Gastrointestinal: Negative for nausea, vomiting, abdominal pain, diarrhea and constipation.  Neurological: Negative for dizziness.   Physical Exam   Blood pressure 139/84, pulse 64, temperature 97.4 F (36.3 C), temperature source Oral, resp. rate 18, last menstrual period  03/30/2012.  Physical Exam  Vitals reviewed. Constitutional: She is oriented to person, place, and time. She appears well-developed and well-nourished.  Cardiovascular: Normal rate and regular rhythm.   No murmur heard. Neurological: She is alert and oriented to person, place, and time.  HEENT: No facial edema Extremities: Bilateral leg edema, non-pitting. No arm edema  Labs: Lab Results  Component Value Date   WBC 10.3 11/25/2012   HGB 12.3 11/25/2012   HCT 36.3 11/25/2012   MCV 82.5 11/25/2012   PLT 210 11/25/2012   CMP     Component Value Date/Time   NA 137 11/25/2012 1553   K 4.1 11/25/2012 1553   CL 105 11/25/2012 1553   CO2 21 11/25/2012 1553   GLUCOSE 82 11/25/2012 1553   BUN 7 11/25/2012 1553   CREATININE 0.59 11/25/2012 1553   CALCIUM 9.1 11/25/2012 1553   PROT 5.8* 11/25/2012 1553   ALBUMIN 2.8* 11/25/2012 1553   AST 18 11/25/2012 1553   ALT 19 11/25/2012 1553   ALKPHOS 103 11/25/2012 1553   BILITOT 0.4 11/25/2012 1553   GFRNONAA >90 11/25/2012 1553   GFRAA >90 11/25/2012 1553    MAU Course  Procedures: None  Assessment and Plan   Hypertension - 27 yo G2P1001 at [redacted]w[redacted]d currently with normal blood pressure and labs non-suggestive of pregnancy induced hypertension. - obtain urine protein/cretinine; if normal, can go home and follow-up in 1 weeks - tylenol for headaches   Jacquelin Hawking,  MD 11/25/2012, 4:13 PM   Filed Vitals:   11/25/12 1645 11/25/12 1700 11/25/12 1715 11/25/12 1730  BP: 133/84 129/83 127/79 122/70  Pulse: 64 60 59 57  Temp:      TempSrc:      Resp:       Follow-up Information   Follow up with [facility] In 3 days. (Blood pressure check on Friday)    Specialty:  Obstetrics and Gynecology   Contact information:   [address] [place] [address] [phone]    Home with preE precautions due to borderline BP elevations at [facility] today.  Evaluation and management procedures were performed by Resident physician under my  supervision/collaboration. Chart reviewed, patient examined by me and I agree with management and plan.

## 2012-11-25 NOTE — Progress Notes (Signed)
  Subjective:    Denis Koppel is a 27 y.o. female being seen today for her obstetrical visit. She is at [redacted]w[redacted]d gestation. Patient reports headache, no bleeding, no contractions, no cramping, no leaking and no vision changes, no abd pain, swellling at end of day.. Fetal movement: normal.  Menstrual History: OB History   Grav Para Term Preterm Abortions TAB SAB Ect Mult Living   2 1 1       1        Patient's last menstrual period was 03/30/2012.    The following portions of the patient's history were reviewed and updated as appropriate: allergies, current medications, past family history, past medical history, past social history, past surgical history and problem list.  Review of Systems Pertinent items are noted in HPI.   Objective:    BP 137/90  Temp(Src) 97 F (36.1 C)  Wt 103.057 kg (227 lb 3.2 oz)  BMI 38.98 kg/m2  LMP 03/30/2012 FHT:  151 BPM  Uterine Size: 33 cm  Presentation: unsure   1+ edema bilaterally on lower ext  Assessment:   Marialy Urbanczyk is a 27 y.o. G2P1001 at [redacted]w[redacted]d here for ROB visit.  Discussed with Patient:  -Plans to  Breast feed.  All questions answered. -Continue prenatal vitamins. -Reviewed fetal kick counts Pt to perform daily at a time when the baby is active, lie laterally with both hands on belly in quiet room and count all movements (hiccups, shoulder rolls, obvious kicks, etc); pt is to report to clinic L&D for less than 10 movements felt in a one hour time period-pt told as soon as she counts 10 movements the count is complete.  - Routine precautions discussed (depression, infection s/s).   Patient provided with all pertinent phone numbers for emergencies. - RTC for any VB, regular, painful cramps/ctxs occurring at a rate of >2/10 min, fever (100.5 or higher), n/v/d, any pain that is unresolving or worsening, LOF, decreased fetal movement, CP, SOB, edema - RTC in 2 weeks for next appt. - Did GBS swabs today and will f/u results and call if  abnormal.  Contact#:  Pt has no drug allergies, so will get PCN if GBS+ OR will get sensitivities on GBS swab as pt is PCN-allergic.  Elevated bp, will send to triage for lab evaluation.  Problems: Patient Active Problem List   Diagnosis Date Noted  . Late prenatal care complicating pregnancy in third trimester 10/29/2012  . Previous cesarean delivery affecting pregnancy, antepartum 10/29/2012    To Do: 1. Tdap will be given today 2. Tubal consent, [place] concsent  [ ]  Vaccines: Flu:  Tdap:  [ ]  BCM: BTL consent today [ ]  Readiness: baby has a place to sleep, car seat, other baby necessities.  Edu: [x ] PTL precautions; [ ]  BF class; [ ]  childbirth class; [ ]   BF counseling;   Tawana Scale, MD OB Fellow

## 2012-11-25 NOTE — Addendum Note (Signed)
Addended by: Minta Balsam on: 11/25/2012 02:37 PM   Modules accepted: Orders

## 2012-12-05 ENCOUNTER — Encounter: Payer: Self-pay | Admitting: *Deleted

## 2012-12-09 ENCOUNTER — Ambulatory Visit (INDEPENDENT_AMBULATORY_CARE_PROVIDER_SITE_OTHER): Payer: Medicaid Other | Admitting: Family Medicine

## 2012-12-09 VITALS — BP 127/91 | Temp 98.1°F | Wt 228.8 lb

## 2012-12-09 DIAGNOSIS — O133 Gestational [pregnancy-induced] hypertension without significant proteinuria, third trimester: Secondary | ICD-10-CM

## 2012-12-09 DIAGNOSIS — O139 Gestational [pregnancy-induced] hypertension without significant proteinuria, unspecified trimester: Secondary | ICD-10-CM | POA: Insufficient documentation

## 2012-12-09 DIAGNOSIS — O0993 Supervision of high risk pregnancy, unspecified, third trimester: Secondary | ICD-10-CM

## 2012-12-09 LAB — POCT URINALYSIS DIP (DEVICE)
Bilirubin Urine: NEGATIVE
Glucose, UA: NEGATIVE mg/dL
Hgb urine dipstick: NEGATIVE
Ketones, ur: NEGATIVE mg/dL
Specific Gravity, Urine: 1.015 (ref 1.005–1.030)
Urobilinogen, UA: 0.2 mg/dL (ref 0.0–1.0)

## 2012-12-09 NOTE — Progress Notes (Signed)
Pulse: 68

## 2012-12-09 NOTE — Progress Notes (Signed)
  Subjective:    Wendy Blevins is a 27 y.o. female being seen today for her obstetrical visit. She is at [redacted]w[redacted]d gestation. Patient reports no bleeding, no contractions, no cramping and no leaking. Fetal movement: normal.  Menstrual History: OB History   Grav Para Term Preterm Abortions TAB SAB Ect Mult Living   2 1 1       1        Patient's last menstrual period was 03/30/2012.    The following portions of the patient's history were reviewed and updated as appropriate: allergies, current medications, past family history, past medical history, past social history, past surgical history and problem list.  Review of Systems Pertinent items are noted in HPI.   Objective:    BP 127/91  Temp(Src) 98.1 F (36.7 C)  Wt 103.783 kg (228 lb 12.8 oz)  BMI 39.25 kg/m2  LMP 03/30/2012 FHT:  150 BPM  Uterine Size: 33 cm and size equals dates  Presentation:      Assessment:  Wendy Blevins is a 27 y.o. G2P1001 at [redacted]w[redacted]d for ROB  Plan:   GHTN: normal labs, mild range. Will move to [facility] and start NST/AFI per routine. Discussed risks and need for induction. 24hr urine  Discussed with Patient:  -Plans to breast feed.  All questions answered. -Continue prenatal vitamins. -Reviewed fetal kick counts Pt to perform daily at a time when the baby is active, lie laterally with both hands on belly in quiet room and count all movements (hiccups, shoulder rolls, obvious kicks, etc); pt is to report to clinic L&D for less than 10 movements felt in a one hour time period-pt told as soon as she counts 10 movements the count is complete.  - Routine precautions discussed (depression, infection s/s).   Patient provided with all pertinent phone numbers for emergencies. - RTC for any VB, regular, painful cramps/ctxs occurring at a rate of >2/10 min, fever (100.5 or higher), n/v/d, any pain that is unresolving or worsening, LOF, decreased fetal movement, CP, SOB, edema - RTC in 2 weeks for next  appt.   Problems: Patient Active Problem List   Diagnosis Date Noted  . Late prenatal care complicating pregnancy in third trimester 10/29/2012  . Previous cesarean delivery affecting pregnancy, antepartum 10/29/2012    To Do: HRC NST/AFIs  [ ]  Vaccines: Flu:  Tdap: rec'd [ ]  BCM:  BTL [ ]  Readiness: baby has a place to sleep, car seat, other baby necessities.  Edu: [x ] PTL precautions; [ ]  BF class; [ ]  childbirth class; [ ]   BF counseling;  Tawana Scale, MD OB Fellow  NST: Reactive and reassuing. FHR 130s, mod variability, mult accels no signficant decels. Required acoustic stim.

## 2012-12-09 NOTE — Patient Instructions (Addendum)
Pregnancy - Third Trimester The third trimester of pregnancy (the last 3 months) is a period of the most rapid growth for you and your baby. The baby approaches a length of 20 inches and a weight of 6 to 10 pounds. The baby is adding on fat and getting ready for life outside your body. While inside, babies have periods of sleeping and waking, sucking thumbs, and hiccuping. You can often feel small contractions of the uterus. This is false labor. It is also called Braxton-Hicks contractions. This is like a practice for labor. The usual problems in this stage of pregnancy include more difficulty breathing, swelling of the hands and feet from water retention, and having to urinate more often because of the uterus and baby pressing on your bladder.  PRENATAL EXAMS  Blood work may continue to be done during prenatal exams. These tests are done to check on your health and the probable health of your baby. Blood work is used to follow your blood levels (hemoglobin). Anemia (low hemoglobin) is common during pregnancy. Iron and vitamins are given to help prevent this. You may also continue to be checked for diabetes. Some of the past blood tests may be done again.  The size of the uterus is measured during each visit. This makes sure your baby is growing properly according to your pregnancy dates.  Your blood pressure is checked every prenatal visit. This is to make sure you are not getting toxemia.  Your urine is checked every prenatal visit for infection, diabetes, and protein.  Your weight is checked at each visit. This is done to make sure gains are happening at the suggested rate and that you and your baby are growing normally.  Sometimes, an ultrasound is performed to confirm the position and the proper growth and development of the baby. This is a test done that bounces harmless sound waves off the baby so your caregiver can more accurately determine a due date.  Discuss the type of pain medicine and  anesthesia you will have during your labor and delivery.  Discuss the possibility and anesthesia if a cesarean section might be necessary.  Inform your caregiver if there is any mental or physical violence at home. Sometimes, a specialized non-stress test, contraction stress test, and biophysical profile are done to make sure the baby is not having a problem. Checking the amniotic fluid surrounding the baby is called an amniocentesis. The amniotic fluid is removed by sticking a needle into the belly (abdomen). This is sometimes done near the end of pregnancy if an early delivery is required. In this case, it is done to help make sure the baby's lungs are mature enough for the baby to live outside of the womb. If the lungs are not mature and it is unsafe to deliver the baby, an injection of cortisone medicine is given to the mother 1 to 2 days before the delivery. This helps the baby's lungs mature and makes it safer to deliver the baby. CHANGES OCCURING IN THE THIRD TRIMESTER OF PREGNANCY Your body goes through many changes during pregnancy. They vary from person to person. Talk to your caregiver about changes you notice and are concerned about.  During the last trimester, you have probably had an increase in your appetite. It is normal to have cravings for certain foods. This varies from person to person and pregnancy to pregnancy.  You may begin to get stretch marks on your hips, abdomen, and breasts. These are normal changes in the body   during pregnancy. There are no exercises or medicines to take which prevent this change.  Constipation may be treated with a stool softener or adding bulk to your diet. Drinking lots of fluids, fiber in vegetables, fruits, and whole grains are helpful.  Exercising is also helpful. If you have been very active up until your pregnancy, most of these activities can be continued during your pregnancy. If you have been less active, it is helpful to start an exercise  program such as walking. Consult your caregiver before starting exercise programs.  Avoid all smoking, alcohol, non-prescribed drugs, herbs and "street drugs" during your pregnancy. These chemicals affect the formation and growth of the baby. Avoid chemicals throughout the pregnancy to ensure the delivery of a healthy infant.  Backache, varicose veins, and hemorrhoids may develop or get worse.  You will tire more easily in the third trimester, which is normal.  The baby's movements may be stronger and more often.  You may become short of breath easily.  Your belly button may stick out.  A yellow discharge may leak from your breasts called colostrum.  You may have a bloody mucus discharge. This usually occurs a few days to a week before labor begins. HOME CARE INSTRUCTIONS   Keep your caregiver's appointments. Follow your caregiver's instructions regarding medicine use, exercise, and diet.  During pregnancy, you are providing food for you and your baby. Continue to eat regular, well-balanced meals. Choose foods such as meat, fish, milk and other low fat dairy products, vegetables, fruits, and whole-grain breads and cereals. Your caregiver will tell you of the ideal weight gain.  A physical sexual relationship may be continued throughout pregnancy if there are no other problems such as early (premature) leaking of amniotic fluid from the membranes, vaginal bleeding, or belly (abdominal) pain.  Exercise regularly if there are no restrictions. Check with your caregiver if you are unsure of the safety of your exercises. Greater weight gain will occur in the last 2 trimesters of pregnancy. Exercising helps:  Control your weight.  Get you in shape for labor and delivery.  You lose weight after you deliver.  Rest a lot with legs elevated, or as needed for leg cramps or low back pain.  Wear a good support or jogging bra for breast tenderness during pregnancy. This may help if worn during  sleep. Pads or tissues may be used in the bra if you are leaking colostrum.  Do not use hot tubs, steam rooms, or saunas.  Wear your seat belt when driving. This protects you and your baby if you are in an accident.  Avoid raw meat, cat litter boxes and soil used by cats. These carry germs that can cause birth defects in the baby.  It is easier to leak urine during pregnancy. Tightening up and strengthening the pelvic muscles will help with this problem. You can practice stopping your urination while you are going to the bathroom. These are the same muscles you need to strengthen. It is also the muscles you would use if you were trying to stop from passing gas. You can practice tightening these muscles up 10 times a set and repeating this about 3 times per day. Once you know what muscles to tighten up, do not perform these exercises during urination. It is more likely to cause an infection by backing up the urine.  Ask for help if you have financial, counseling, or nutritional needs during pregnancy. Your caregiver will be able to offer counseling for these   needs as well as refer you for other special needs.  Make a list of emergency phone numbers and have them available.  Plan on getting help from family or friends when you go home from the hospital.  Make a trial run to the hospital.  Take prenatal classes with the father to understand, practice, and ask questions about the labor and delivery.  Prepare the baby's room or nursery.  Do not travel out of the city unless it is absolutely necessary and with the advice of your caregiver.  Wear only low or no heal shoes to have better balance and prevent falling. MEDICINES AND DRUG USE IN PREGNANCY  Take prenatal vitamins as directed. The vitamin should contain 1 milligram of folic acid. Keep all vitamins out of reach of children. Only a couple vitamins or tablets containing iron may be fatal to a baby or young child when ingested.  Avoid use  of all medicines, including herbs, over-the-counter medicines, not prescribed or suggested by your caregiver. Only take over-the-counter or prescription medicines for pain, discomfort, or fever as directed by your caregiver. Do not use aspirin, ibuprofen or naproxen unless approved by your caregiver.  Let your caregiver also know about herbs you may be using.  Alcohol is related to a number of birth defects. This includes fetal alcohol syndrome. All alcohol, in any form, should be avoided completely. Smoking will cause low birth rate and premature babies.  Illegal drugs are very harmful to the baby. They are absolutely forbidden. A baby born to an addicted mother will be addicted at birth. The baby will go through the same withdrawal an adult does. SEEK MEDICAL CARE IF: You have any concerns or worries during your pregnancy. It is better to call with your questions if you feel they cannot wait, rather than worry about them. SEEK IMMEDIATE MEDICAL CARE IF:   An unexplained oral temperature above 102 F (38.9 C) develops, or as your caregiver suggests.  You have leaking of fluid from the vagina. If leaking membranes are suspected, take your temperature and tell your caregiver of this when you call.  There is vaginal spotting, bleeding or passing clots. Tell your caregiver of the amount and how many pads are used.  You develop a bad smelling vaginal discharge with a change in the color from clear to white.  You develop vomiting that lasts more than 24 hours.  You develop chills or fever.  You develop shortness of breath.  You develop burning on urination.  You loose more than 2 pounds of weight or gain more than 2 pounds of weight or as suggested by your caregiver.  You notice sudden swelling of your face, hands, and feet or legs.  You develop belly (abdominal) pain. Round ligament discomfort is a common non-cancerous (benign) cause of abdominal pain in pregnancy. Your caregiver still  must evaluate you.  You develop a severe headache that does not go away.  You develop visual problems, blurred or double vision.  If you have not felt your baby move for more than 1 hour. If you think the baby is not moving as much as usual, eat something with sugar in it and lie down on your left side for an hour. The baby should move at least 4 to 5 times per hour. Call right away if your baby moves less than that.  You fall, are in a car accident, or any kind of trauma.  There is mental or physical violence at home. Document Released: 03/20/2001   Document Revised: 12/19/2011 Document Reviewed: 09/22/2008 [facility] Patient Information 2014 [place], [place].  Hypertension During Pregnancy Hypertension is also called high blood pressure. It can occur at any time in life and during pregnancy. When you have hypertension, there is extra pressure inside your blood vessels that carry blood from the heart to the rest of your body (arteries). Hypertension during pregnancy can cause problems for you and your baby. Your baby might not weigh as much as it should at birth or might be born early (premature). Very bad cases of hypertension during pregnancy can be life-threatening.  There are different types of hypertension during pregnancy.   Chronic hypertension. This happens when a woman has hypertension before pregnancy and it continues during pregnancy.  Gestational hypertension. This is when hypertension develops during pregnancy.  Preeclampsia or toxemia of pregnancy. This is a very serious type of hypertension that develops only during pregnancy. It is a disease that affects the whole body (systemic) and can be very dangerous for both mother and baby.  Gestational hypertension and preeclampsia usually go away after your baby is born. Blood pressure generally stabilizes within 6 weeks. Women who have hypertension during pregnancy have a greater chance of developing hypertension later in life or with  future pregnancies. UNDERSTANDING BLOOD PRESSURE Blood pressure moves blood in your body. Sometimes, the force that moves the blood becomes too strong.  A blood pressure reading is given in 2 numbers and looks like a fraction.  The top number is called the systolic pressure. When your heart beats, it forces more blood to flow through the arteries. Pressure inside the arteries goes up.  The bottom number is the diastolic pressure. Pressure goes down between beats. That is when the heart is resting.  You may have hypertension if:  Your systolic blood pressure is above 140.  Your diastolic pressure is above 90. RISK FACTORS Some factors make you more likely to develop hypertension during pregnancy. Risk factors include:  Having hypertension before pregnancy.  Having hypertension during a previous pregnancy.  Being overweight.  Being older than 40.  Being pregnant with more than 1 baby (multiples).  Having diabetes or kidney problems. SYMPTOMS Chronic and gestational hypertension may not cause symptoms. Preeclampsia has symptoms, which may include:  Increased protein in your urine. Your caregiver will check for this at every prenatal visit.  Swelling of your hands and face.  Rapid weight gain.  Headaches.  Visual changes.  Being bothered by light.  Abdominal pain, especially in the right upper area.  Chest pain.  Shortness of breath.  Increased reflexes.  Seizures. Seizures occur with a more severe form of preeclampsia, called eclampsia. DIAGNOSIS   You may be diagnosed with hypertension during pregnancy during a regular prenatal exam. At each visit, tests may include:  Blood pressure checks.  A urine test to check for protein in your urine.  The type of hypertension you are diagnosed with depends on when you developed it. It also depends on your specific blood pressure reading.  Developing hypertension before 20 weeks of pregnancy is consistent with chronic  hypertension.  Developing hypertension after 20 weeks of pregnancy is consistent with gestational hypertension.  Hypertension with increased urinary protein is diagnosed as preeclampsia.  Blood pressure measurements that stay above 160 systolic or 110 diastolic are a sign of severe preeclampsia. TREATMENT Treatment for hypertension during pregnancy varies. Treatment depends on the type of hypertension and how serious it is.  If you take medicine for chronic hypertension, you may need  to switch medicines.  Drugs called ACE inhibitors should not be taken during pregnancy.  Low-dose aspirin may be suggested for women who have risk factors for preeclampsia.  If you have gestational hypertension, you may need to take a blood pressure medicine that is safe during pregnancy. Your caregiver will recommend the appropriate medicine.  If you have severe preeclampsia, you may need to be in the hospital. Caregivers will watch you and the baby very closely. You also may need to take medicine (magnesium sulfate) to prevent seizures and lower blood pressure.  Sometimes an early delivery is needed. This may be the case if the condition worsens. It would be done to protect you and the baby. The only cure for preeclampsia is delivery. HOME CARE INSTRUCTIONS  Schedule and keep all of your regular prenatal care.  Follow your caregiver's instructions for taking medicines. Tell your caregiver about all medicines you take. This includes over-the-counter medicines.  Eat as little salt as possible.  Get regular exercise.  Do not drink alcohol.  Do not use tobacco products.  Do not drink products with caffeine.  Lie on your left side when resting.  Tell your doctor if you have any preeclampsia symptoms. SEEK IMMEDIATE MEDICAL CARE IF:  You have severe abdominal pain.  You have sudden swelling in the hands, ankles, or face.  You gain 4 pounds (1.8 kg) or more in 1 week.  You vomit  repeatedly.  You have vaginal bleeding.  You do not feel the baby moving as much.  You have a headache.  You have blurred or double vision.  You have muscle twitching or spasms.  You have shortness of breath.  You have blue fingernails and lips.  You have blood in your urine. MAKE SURE YOU:  Understand these instructions.  Will watch your condition.  Will get help right away if you are not doing well. Document Released: 12/12/2010 Document Revised: 06/18/2011 Document Reviewed: 12/12/2010 [facility] Patient Information 2014 [place], [place]. Pregnancy - Third Trimester The third trimester of pregnancy (the last 3 months) is a period of the most rapid growth for you and your baby. The baby approaches a length of 20 inches and a weight of 6 to 10 pounds. The baby is adding on fat and getting ready for life outside your body. While inside, babies have periods of sleeping and waking, sucking thumbs, and hiccuping. You can often feel small contractions of the uterus. This is false labor. It is also called Braxton-Hicks contractions. This is like a practice for labor. The usual problems in this stage of pregnancy include more difficulty breathing, swelling of the hands and feet from water retention, and having to urinate more often because of the uterus and baby pressing on your bladder.  PRENATAL EXAMS  Blood work may continue to be done during prenatal exams. These tests are done to check on your health and the probable health of your baby. Blood work is used to follow your blood levels (hemoglobin). Anemia (low hemoglobin) is common during pregnancy. Iron and vitamins are given to help prevent this. You may also continue to be checked for diabetes. Some of the past blood tests may be done again.  The size of the uterus is measured during each visit. This makes sure your baby is growing properly according to your pregnancy dates.  Your blood pressure is checked every prenatal visit. This  is to make sure you are not getting toxemia.  Your urine is checked every prenatal visit for infection,  diabetes, and protein.  Your weight is checked at each visit. This is done to make sure gains are happening at the suggested rate and that you and your baby are growing normally.  Sometimes, an ultrasound is performed to confirm the position and the proper growth and development of the baby. This is a test done that bounces harmless sound waves off the baby so your caregiver can more accurately determine a due date.  Discuss the type of pain medicine and anesthesia you will have during your labor and delivery.  Discuss the possibility and anesthesia if a cesarean section might be necessary.  Inform your caregiver if there is any mental or physical violence at home. Sometimes, a specialized non-stress test, contraction stress test, and biophysical profile are done to make sure the baby is not having a problem. Checking the amniotic fluid surrounding the baby is called an amniocentesis. The amniotic fluid is removed by sticking a needle into the belly (abdomen). This is sometimes done near the end of pregnancy if an early delivery is required. In this case, it is done to help make sure the baby's lungs are mature enough for the baby to live outside of the womb. If the lungs are not mature and it is unsafe to deliver the baby, an injection of cortisone medicine is given to the mother 1 to 2 days before the delivery. This helps the baby's lungs mature and makes it safer to deliver the baby. CHANGES OCCURING IN THE THIRD TRIMESTER OF PREGNANCY Your body goes through many changes during pregnancy. They vary from person to person. Talk to your caregiver about changes you notice and are concerned about.  During the last trimester, you have probably had an increase in your appetite. It is normal to have cravings for certain foods. This varies from person to person and pregnancy to pregnancy.  You may  begin to get stretch marks on your hips, abdomen, and breasts. These are normal changes in the body during pregnancy. There are no exercises or medicines to take which prevent this change.  Constipation may be treated with a stool softener or adding bulk to your diet. Drinking lots of fluids, fiber in vegetables, fruits, and whole grains are helpful.  Exercising is also helpful. If you have been very active up until your pregnancy, most of these activities can be continued during your pregnancy. If you have been less active, it is helpful to start an exercise program such as walking. Consult your caregiver before starting exercise programs.  Avoid all smoking, alcohol, non-prescribed drugs, herbs and "street drugs" during your pregnancy. These chemicals affect the formation and growth of the baby. Avoid chemicals throughout the pregnancy to ensure the delivery of a healthy infant.  Backache, varicose veins, and hemorrhoids may develop or get worse.  You will tire more easily in the third trimester, which is normal.  The baby's movements may be stronger and more often.  You may become short of breath easily.  Your belly button may stick out.  A yellow discharge may leak from your breasts called colostrum.  You may have a bloody mucus discharge. This usually occurs a few days to a week before labor begins. HOME CARE INSTRUCTIONS   Keep your caregiver's appointments. Follow your caregiver's instructions regarding medicine use, exercise, and diet.  During pregnancy, you are providing food for you and your baby. Continue to eat regular, well-balanced meals. Choose foods such as meat, fish, milk and other low fat dairy products, vegetables, fruits,  and whole-grain breads and cereals. Your caregiver will tell you of the ideal weight gain.  A physical sexual relationship may be continued throughout pregnancy if there are no other problems such as early (premature) leaking of amniotic fluid from  the membranes, vaginal bleeding, or belly (abdominal) pain.  Exercise regularly if there are no restrictions. Check with your caregiver if you are unsure of the safety of your exercises. Greater weight gain will occur in the last 2 trimesters of pregnancy. Exercising helps:  Control your weight.  Get you in shape for labor and delivery.  You lose weight after you deliver.  Rest a lot with legs elevated, or as needed for leg cramps or low back pain.  Wear a good support or jogging bra for breast tenderness during pregnancy. This may help if worn during sleep. Pads or tissues may be used in the bra if you are leaking colostrum.  Do not use hot tubs, steam rooms, or saunas.  Wear your seat belt when driving. This protects you and your baby if you are in an accident.  Avoid raw meat, cat litter boxes and soil used by cats. These carry germs that can cause birth defects in the baby.  It is easier to leak urine during pregnancy. Tightening up and strengthening the pelvic muscles will help with this problem. You can practice stopping your urination while you are going to the bathroom. These are the same muscles you need to strengthen. It is also the muscles you would use if you were trying to stop from passing gas. You can practice tightening these muscles up 10 times a set and repeating this about 3 times per day. Once you know what muscles to tighten up, do not perform these exercises during urination. It is more likely to cause an infection by backing up the urine.  Ask for help if you have financial, counseling, or nutritional needs during pregnancy. Your caregiver will be able to offer counseling for these needs as well as refer you for other special needs.  Make a list of emergency phone numbers and have them available.  Plan on getting help from family or friends when you go home from the hospital.  Make a trial run to the hospital.  Take prenatal classes with the father to understand,  practice, and ask questions about the labor and delivery.  Prepare the baby's room or nursery.  Do not travel out of the city unless it is absolutely necessary and with the advice of your caregiver.  Wear only low or no heal shoes to have better balance and prevent falling. MEDICINES AND DRUG USE IN PREGNANCY  Take prenatal vitamins as directed. The vitamin should contain 1 milligram of folic acid. Keep all vitamins out of reach of children. Only a couple vitamins or tablets containing iron may be fatal to a baby or young child when ingested.  Avoid use of all medicines, including herbs, over-the-counter medicines, not prescribed or suggested by your caregiver. Only take over-the-counter or prescription medicines for pain, discomfort, or fever as directed by your caregiver. Do not use aspirin, ibuprofen or naproxen unless approved by your caregiver.  Let your caregiver also know about herbs you may be using.  Alcohol is related to a number of birth defects. This includes fetal alcohol syndrome. All alcohol, in any form, should be avoided completely. Smoking will cause low birth rate and premature babies.  Illegal drugs are very harmful to the baby. They are absolutely forbidden. A baby born  to an addicted mother will be addicted at birth. The baby will go through the same withdrawal an adult does. SEEK MEDICAL CARE IF: You have any concerns or worries during your pregnancy. It is better to call with your questions if you feel they cannot wait, rather than worry about them. SEEK IMMEDIATE MEDICAL CARE IF:   An unexplained oral temperature above 102 F (38.9 C) develops, or as your caregiver suggests.  You have leaking of fluid from the vagina. If leaking membranes are suspected, take your temperature and tell your caregiver of this when you call.  There is vaginal spotting, bleeding or passing clots. Tell your caregiver of the amount and how many pads are used.  You develop a bad smelling  vaginal discharge with a change in the color from clear to white.  You develop vomiting that lasts more than 24 hours.  You develop chills or fever.  You develop shortness of breath.  You develop burning on urination.  You loose more than 2 pounds of weight or gain more than 2 pounds of weight or as suggested by your caregiver.  You notice sudden swelling of your face, hands, and feet or legs.  You develop belly (abdominal) pain. Round ligament discomfort is a common non-cancerous (benign) cause of abdominal pain in pregnancy. Your caregiver still must evaluate you.  You develop a severe headache that does not go away.  You develop visual problems, blurred or double vision.  If you have not felt your baby move for more than 1 hour. If you think the baby is not moving as much as usual, eat something with sugar in it and lie down on your left side for an hour. The baby should move at least 4 to 5 times per hour. Call right away if your baby moves less than that.  You fall, are in a car accident, or any kind of trauma.  There is mental or physical violence at home. Document Released: 03/20/2001 Document Revised: 12/19/2011 Document Reviewed: 09/22/2008 [facility] Patient Information 2014 [place], [place]. Place 32-42 weeks prenatal visit patient instructions here.

## 2012-12-15 ENCOUNTER — Other Ambulatory Visit: Payer: Self-pay

## 2012-12-15 ENCOUNTER — Encounter: Payer: Self-pay | Admitting: *Deleted

## 2012-12-16 ENCOUNTER — Other Ambulatory Visit: Payer: Self-pay

## 2012-12-18 ENCOUNTER — Ambulatory Visit (INDEPENDENT_AMBULATORY_CARE_PROVIDER_SITE_OTHER): Payer: Medicaid Other | Admitting: *Deleted

## 2012-12-18 ENCOUNTER — Encounter: Payer: Self-pay | Admitting: *Deleted

## 2012-12-18 VITALS — BP 130/73

## 2012-12-18 DIAGNOSIS — O139 Gestational [pregnancy-induced] hypertension without significant proteinuria, unspecified trimester: Secondary | ICD-10-CM

## 2012-12-18 DIAGNOSIS — O133 Gestational [pregnancy-induced] hypertension without significant proteinuria, third trimester: Secondary | ICD-10-CM

## 2012-12-18 DIAGNOSIS — O0933 Supervision of pregnancy with insufficient antenatal care, third trimester: Secondary | ICD-10-CM

## 2012-12-18 NOTE — Progress Notes (Signed)
P= 63 

## 2012-12-22 ENCOUNTER — Other Ambulatory Visit: Payer: Self-pay

## 2012-12-24 ENCOUNTER — Encounter: Payer: Self-pay | Admitting: *Deleted

## 2012-12-29 ENCOUNTER — Ambulatory Visit (INDEPENDENT_AMBULATORY_CARE_PROVIDER_SITE_OTHER): Payer: Medicaid Other | Admitting: Obstetrics & Gynecology

## 2012-12-29 VITALS — BP 137/84 | Wt 232.4 lb

## 2012-12-29 DIAGNOSIS — O139 Gestational [pregnancy-induced] hypertension without significant proteinuria, unspecified trimester: Secondary | ICD-10-CM

## 2012-12-29 DIAGNOSIS — O0933 Supervision of pregnancy with insufficient antenatal care, third trimester: Secondary | ICD-10-CM

## 2012-12-29 DIAGNOSIS — O133 Gestational [pregnancy-induced] hypertension without significant proteinuria, third trimester: Secondary | ICD-10-CM

## 2012-12-29 DIAGNOSIS — O0930 Supervision of pregnancy with insufficient antenatal care, unspecified trimester: Secondary | ICD-10-CM

## 2012-12-29 LAB — POCT URINALYSIS DIP (DEVICE)
Bilirubin Urine: NEGATIVE
Glucose, UA: NEGATIVE mg/dL
Hgb urine dipstick: NEGATIVE
Nitrite: NEGATIVE
Specific Gravity, Urine: <=1.005 (ref 1.005–1.030)
pH: 5.5 (ref 5.0–8.0)

## 2012-12-29 NOTE — Progress Notes (Signed)
P = 66   Pt reports occasional H/A's- none today.

## 2012-12-29 NOTE — Progress Notes (Signed)
NST reactive. CT, GC, GBS.  Still considering TOLAC. Notes BP 148/89 on ED visit in 2009 suggests hypertension long-standing

## 2012-12-30 LAB — GC/CHLAMYDIA PROBE AMP: GC Probe RNA: NEGATIVE

## 2013-01-01 LAB — CULTURE, BETA STREP (GROUP B ONLY)

## 2013-01-03 NOTE — Progress Notes (Signed)
NST reactive on 12/18/12 

## 2013-01-05 ENCOUNTER — Encounter: Payer: Self-pay | Admitting: Obstetrics and Gynecology

## 2013-01-05 ENCOUNTER — Ambulatory Visit (INDEPENDENT_AMBULATORY_CARE_PROVIDER_SITE_OTHER): Payer: Medicaid Other | Admitting: Obstetrics and Gynecology

## 2013-01-05 VITALS — BP 134/88 | Wt 232.5 lb

## 2013-01-05 DIAGNOSIS — O139 Gestational [pregnancy-induced] hypertension without significant proteinuria, unspecified trimester: Secondary | ICD-10-CM

## 2013-01-05 DIAGNOSIS — O0930 Supervision of pregnancy with insufficient antenatal care, unspecified trimester: Secondary | ICD-10-CM

## 2013-01-05 DIAGNOSIS — O34219 Maternal care for unspecified type scar from previous cesarean delivery: Secondary | ICD-10-CM

## 2013-01-05 DIAGNOSIS — O133 Gestational [pregnancy-induced] hypertension without significant proteinuria, third trimester: Secondary | ICD-10-CM

## 2013-01-05 DIAGNOSIS — O0933 Supervision of pregnancy with insufficient antenatal care, third trimester: Secondary | ICD-10-CM

## 2013-01-05 LAB — POCT URINALYSIS DIP (DEVICE)
Bilirubin Urine: NEGATIVE
Glucose, UA: NEGATIVE mg/dL
Ketones, ur: NEGATIVE mg/dL
Leukocytes, UA: NEGATIVE
Protein, ur: NEGATIVE mg/dL
Specific Gravity, Urine: 1.01 (ref 1.005–1.030)

## 2013-01-05 NOTE — Progress Notes (Signed)
NST reviewed and reactive. Patient doing well without complaints. FM/labor/precautions precautions reviewed. Patient opted for repeat cesarean section. Patient prefers to have it done on 10/4 but will accept a 10/6 date.

## 2013-01-05 NOTE — Progress Notes (Signed)
P = 57   Pt desires repeat C/S.

## 2013-01-07 ENCOUNTER — Encounter (HOSPITAL_COMMUNITY): Payer: Self-pay | Admitting: Pharmacist

## 2013-01-09 ENCOUNTER — Encounter (HOSPITAL_COMMUNITY)
Admission: RE | Admit: 2013-01-09 | Discharge: 2013-01-09 | Disposition: A | Discharge status: Home / Self Care | Payer: Medicaid Other | Source: Ambulatory Visit | Attending: Obstetrics & Gynecology | Admitting: Obstetrics & Gynecology

## 2013-01-09 ENCOUNTER — Other Ambulatory Visit: Payer: Medicaid Other

## 2013-01-09 ENCOUNTER — Encounter (HOSPITAL_COMMUNITY): Payer: Self-pay

## 2013-01-09 DIAGNOSIS — Z01812 Encounter for preprocedural laboratory examination: Secondary | ICD-10-CM | POA: Insufficient documentation

## 2013-01-09 DIAGNOSIS — Z01818 Encounter for other preprocedural examination: Secondary | ICD-10-CM | POA: Insufficient documentation

## 2013-01-09 NOTE — Patient Instructions (Addendum)
18 Wendy Blevins  01/09/2013   Your procedure is scheduled on:  01/12/13  Enter through the Main Entrance of [facility] at 845 AM.  Pick up the phone at the desk and dial [phone].   Call this number if you have problems the morning of surgery: [phone]   Remember:   Do not eat food:After Midnight.  Do not drink clear liquids: After Midnight.  Take these medicines the morning of surgery with A SIP OF WATER: Pepcid   Do not wear jewelry, make-up or nail polish.  Do not wear lotions, powders, or perfumes. You may wear deodorant.  Do not shave 48 hours prior to surgery.  Do not bring valuables to the hospital.  [facility] is not   responsible for any belongings or valuables brought to the hospital.  Contacts, dentures or bridgework may not be worn into surgery.  Leave suitcase in the car. After surgery it may be brought to your room.  For patients admitted to the hospital, checkout time is 11:00 AM the day of              discharge.   Patients discharged the day of surgery will not be allowed to drive             home.  Name and phone number of your driver: NA  Special Instructions:   Shower using CHG 2 nights before surgery and the night before surgery.  If you shower the day of surgery use CHG.  Use special wash - you have one bottle of CHG for all showers.  You should use approximately 1/3 of the bottle for each shower.   Please read over the following fact sheets that you were given:   Surgical Site Infection Prevention

## 2013-01-10 ENCOUNTER — Encounter (HOSPITAL_COMMUNITY): Payer: Self-pay

## 2013-01-10 LAB — ABO/RH: ABO/RH(D): O POS

## 2013-01-10 LAB — TYPE AND SCREEN
ABO/RH(D): O POS
Antibody Screen: NEGATIVE

## 2013-01-11 MED ORDER — DEXTROSE 5 % IV SOLN
3.0000 g | INTRAVENOUS | Status: AC
  Administered 2013-01-12: 3 g via INTRAVENOUS
  Filled 2013-01-11: qty 3000

## 2013-01-12 ENCOUNTER — Inpatient Hospital Stay (HOSPITAL_COMMUNITY): Payer: Medicaid Other | Admitting: Anesthesiology

## 2013-01-12 ENCOUNTER — Encounter (HOSPITAL_COMMUNITY)
Admission: RE | Disposition: A | Discharge status: Home / Self Care | Payer: Self-pay | Source: Ambulatory Visit | Attending: Obstetrics & Gynecology

## 2013-01-12 ENCOUNTER — Encounter (HOSPITAL_COMMUNITY): Payer: Self-pay | Admitting: *Deleted

## 2013-01-12 ENCOUNTER — Inpatient Hospital Stay (HOSPITAL_COMMUNITY)
Admission: RE | Admit: 2013-01-12 | Discharge: 2013-01-14 | DRG: 765 | Disposition: A | Discharge status: Home / Self Care | Payer: Medicaid Other | Source: Ambulatory Visit | Attending: Obstetrics & Gynecology | Admitting: Obstetrics & Gynecology

## 2013-01-12 ENCOUNTER — Encounter (HOSPITAL_COMMUNITY): Payer: Self-pay | Admitting: Anesthesiology

## 2013-01-12 DIAGNOSIS — O0933 Supervision of pregnancy with insufficient antenatal care, third trimester: Secondary | ICD-10-CM

## 2013-01-12 DIAGNOSIS — O34219 Maternal care for unspecified type scar from previous cesarean delivery: Secondary | ICD-10-CM

## 2013-01-12 DIAGNOSIS — O139 Gestational [pregnancy-induced] hypertension without significant proteinuria, unspecified trimester: Secondary | ICD-10-CM

## 2013-01-12 DIAGNOSIS — Z302 Encounter for sterilization: Secondary | ICD-10-CM

## 2013-01-12 LAB — CBC
HCT: 36.6 % (ref 36.0–46.0)
Hemoglobin: 12.5 g/dL (ref 12.0–15.0)
Hemoglobin: 9.5 g/dL — ABNORMAL LOW (ref 12.0–15.0)
MCH: 28.5 pg (ref 26.0–34.0)
MCH: 28.8 pg (ref 26.0–34.0)
MCHC: 34.2 g/dL (ref 30.0–36.0)
MCHC: 34.3 g/dL (ref 30.0–36.0)
MCV: 83.9 fL (ref 78.0–100.0)
Platelets: 187 10*3/uL (ref 150–400)
RBC: 3.3 MIL/uL — ABNORMAL LOW (ref 3.87–5.11)
RDW: 14 % (ref 11.5–15.5)
WBC: 8.5 10*3/uL (ref 4.0–10.5)

## 2013-01-12 LAB — TYPE AND SCREEN: ABO/RH(D): O POS

## 2013-01-12 SURGERY — Surgical Case
Anesthesia: Spinal | Site: Abdomen | Wound class: Clean Contaminated

## 2013-01-12 MED ORDER — OXYTOCIN 10 UNIT/ML IJ SOLN
INTRAMUSCULAR | Status: AC
  Filled 2013-01-12: qty 4

## 2013-01-12 MED ORDER — INFLUENZA VAC SPLIT QUAD 0.5 ML IM SUSP
0.5000 mL | INTRAMUSCULAR | Status: AC
  Administered 2013-01-13: 0.5 mL via INTRAMUSCULAR
  Filled 2013-01-12: qty 0.5

## 2013-01-12 MED ORDER — SCOPOLAMINE 1 MG/3DAYS TD PT72: 1.0000 | MEDICATED_PATCH | Freq: Once | TRANSDERMAL | Status: DC

## 2013-01-12 MED ORDER — HYDROMORPHONE HCL PF 1 MG/ML IJ SOLN
INTRAMUSCULAR | Status: AC
  Administered 2013-01-12: 1 mg via INTRAVENOUS
  Filled 2013-01-12: qty 1

## 2013-01-12 MED ORDER — MEASLES, MUMPS & RUBELLA VAC ~~LOC~~ INJ: 0.5000 mL | INJECTION | Freq: Once | SUBCUTANEOUS | Status: DC

## 2013-01-12 MED ORDER — EPHEDRINE 5 MG/ML INJ
INTRAVENOUS | Status: AC
  Filled 2013-01-12: qty 10

## 2013-01-12 MED ORDER — NALBUPHINE HCL 10 MG/ML IJ SOLN
5.0000 mg | INTRAMUSCULAR | Status: DC | PRN
  Administered 2013-01-12: 5 mg via SUBCUTANEOUS
  Filled 2013-01-12 (×2): qty 1

## 2013-01-12 MED ORDER — MEPERIDINE HCL 25 MG/ML IJ SOLN
INTRAMUSCULAR | Status: AC
  Administered 2013-01-12: 6.25 mg via INTRAVENOUS
  Filled 2013-01-12: qty 1

## 2013-01-12 MED ORDER — DIPHENHYDRAMINE HCL 12.5 MG/5ML PO ELIX
12.5000 mg | ORAL_SOLUTION | Freq: Four times a day (QID) | ORAL | Status: DC | PRN
  Filled 2013-01-12: qty 5

## 2013-01-12 MED ORDER — TETANUS-DIPHTH-ACELL PERTUSSIS 5-2.5-18.5 LF-MCG/0.5 IM SUSP: 0.5000 mL | Freq: Once | INTRAMUSCULAR | Status: DC

## 2013-01-12 MED ORDER — NALBUPHINE HCL 10 MG/ML IJ SOLN
5.0000 mg | INTRAMUSCULAR | Status: DC | PRN
  Filled 2013-01-12: qty 1

## 2013-01-12 MED ORDER — SIMETHICONE 80 MG PO CHEW
80.0000 mg | CHEWABLE_TABLET | Freq: Three times a day (TID) | ORAL | Status: DC
  Administered 2013-01-13 – 2013-01-14 (×5): 80 mg via ORAL

## 2013-01-12 MED ORDER — LANOLIN HYDROUS EX OINT: 1.0000 "application " | TOPICAL_OINTMENT | CUTANEOUS | Status: DC | PRN

## 2013-01-12 MED ORDER — METOCLOPRAMIDE HCL 5 MG/ML IJ SOLN: 10.0000 mg | Freq: Three times a day (TID) | INTRAMUSCULAR | Status: DC | PRN

## 2013-01-12 MED ORDER — KETOROLAC TROMETHAMINE 30 MG/ML IJ SOLN
30.0000 mg | Freq: Four times a day (QID) | INTRAMUSCULAR | Status: AC | PRN
  Filled 2013-01-12: qty 1

## 2013-01-12 MED ORDER — MORPHINE SULFATE (PF) 0.5 MG/ML IJ SOLN
INTRAMUSCULAR | Status: DC | PRN
Start: 2013-01-12 — End: 2013-01-12
  Administered 2013-01-12: .15 mg via INTRATHECAL

## 2013-01-12 MED ORDER — ONDANSETRON HCL 4 MG/2ML IJ SOLN: 4.0000 mg | Freq: Four times a day (QID) | INTRAMUSCULAR | Status: DC | PRN

## 2013-01-12 MED ORDER — MAGNESIUM HYDROXIDE 400 MG/5ML PO SUSP: 30.0000 mL | ORAL | Status: DC | PRN

## 2013-01-12 MED ORDER — HYDROMORPHONE HCL PF 1 MG/ML IJ SOLN: 0.2500 mg | INTRAMUSCULAR | Status: DC | PRN

## 2013-01-12 MED ORDER — SCOPOLAMINE 1 MG/3DAYS TD PT72
MEDICATED_PATCH | TRANSDERMAL | Status: AC
  Filled 2013-01-12: qty 1

## 2013-01-12 MED ORDER — KETOROLAC TROMETHAMINE 60 MG/2ML IM SOLN
INTRAMUSCULAR | Status: AC
  Administered 2013-01-12: 60 mg via INTRAMUSCULAR
  Filled 2013-01-12: qty 2

## 2013-01-12 MED ORDER — NALOXONE HCL 1 MG/ML IJ SOLN
1.0000 ug/kg/h | INTRAVENOUS | Status: DC | PRN
  Filled 2013-01-12: qty 2

## 2013-01-12 MED ORDER — PHENYLEPHRINE HCL 10 MG/ML IJ SOLN
INTRAMUSCULAR | Status: DC | PRN
  Administered 2013-01-12: 80 ug via INTRAVENOUS
  Administered 2013-01-12 (×3): 40 ug via INTRAVENOUS

## 2013-01-12 MED ORDER — ONDANSETRON HCL 4 MG/2ML IJ SOLN
4.0000 mg | INTRAMUSCULAR | Status: DC | PRN
  Filled 2013-01-12: qty 2

## 2013-01-12 MED ORDER — FENTANYL CITRATE 0.05 MG/ML IJ SOLN
INTRAMUSCULAR | Status: DC | PRN
  Administered 2013-01-12: 25 ug via INTRATHECAL

## 2013-01-12 MED ORDER — IBUPROFEN 600 MG PO TABS
600.0000 mg | ORAL_TABLET | Freq: Four times a day (QID) | ORAL | Status: DC
  Administered 2013-01-13 – 2013-01-14 (×4): 600 mg via ORAL
  Filled 2013-01-12 (×5): qty 1

## 2013-01-12 MED ORDER — OXYCODONE-ACETAMINOPHEN 5-325 MG PO TABS
1.0000 | ORAL_TABLET | ORAL | Status: DC | PRN
  Administered 2013-01-13 (×2): 2 via ORAL
  Administered 2013-01-14: 1 via ORAL
  Filled 2013-01-12 (×2): qty 1
  Filled 2013-01-12: qty 2
  Filled 2013-01-12: qty 1

## 2013-01-12 MED ORDER — LACTATED RINGERS IV SOLN: INTRAVENOUS | Status: DC

## 2013-01-12 MED ORDER — MENTHOL 3 MG MT LOZG: 1.0000 | LOZENGE | OROMUCOSAL | Status: DC | PRN

## 2013-01-12 MED ORDER — SODIUM CHLORIDE 0.9 % IJ SOLN: 9.0000 mL | INTRAMUSCULAR | Status: DC | PRN

## 2013-01-12 MED ORDER — ONDANSETRON HCL 4 MG/2ML IJ SOLN
INTRAMUSCULAR | Status: DC | PRN
  Administered 2013-01-12: 4 mg via INTRAMUSCULAR

## 2013-01-12 MED ORDER — WITCH HAZEL-GLYCERIN EX PADS: 1.0000 "application " | MEDICATED_PAD | CUTANEOUS | Status: DC | PRN

## 2013-01-12 MED ORDER — PHENYLEPHRINE 40 MCG/ML (10ML) SYRINGE FOR IV PUSH (FOR BLOOD PRESSURE SUPPORT)
PREFILLED_SYRINGE | INTRAVENOUS | Status: AC
  Filled 2013-01-12: qty 5

## 2013-01-12 MED ORDER — LACTATED RINGERS IV SOLN
INTRAVENOUS | Status: DC
  Administered 2013-01-12 – 2013-01-13 (×2): via INTRAVENOUS

## 2013-01-12 MED ORDER — MEPERIDINE HCL 25 MG/ML IJ SOLN
6.2500 mg | INTRAMUSCULAR | Status: DC | PRN
  Administered 2013-01-12: 6.25 mg via INTRAVENOUS

## 2013-01-12 MED ORDER — OXYTOCIN 10 UNIT/ML IJ SOLN
40.0000 [IU] | INTRAVENOUS | Status: DC | PRN
  Administered 2013-01-12: 40 [IU] via INTRAVENOUS

## 2013-01-12 MED ORDER — SCOPOLAMINE 1 MG/3DAYS TD PT72
1.0000 | MEDICATED_PATCH | Freq: Once | TRANSDERMAL | Status: DC
  Administered 2013-01-12: 1.5 mg via TRANSDERMAL

## 2013-01-12 MED ORDER — MORPHINE SULFATE 0.5 MG/ML IJ SOLN
INTRAMUSCULAR | Status: AC
  Filled 2013-01-12: qty 10

## 2013-01-12 MED ORDER — SODIUM CHLORIDE 0.9 % IJ SOLN: 3.0000 mL | INTRAMUSCULAR | Status: DC | PRN

## 2013-01-12 MED ORDER — OXYTOCIN 40 UNITS IN LACTATED RINGERS INFUSION - SIMPLE MED: 62.5000 mL/h | INTRAVENOUS | Status: AC

## 2013-01-12 MED ORDER — DIBUCAINE 1 % RE OINT: 1.0000 "application " | TOPICAL_OINTMENT | RECTAL | Status: DC | PRN

## 2013-01-12 MED ORDER — KETOROLAC TROMETHAMINE 30 MG/ML IJ SOLN: 30.0000 mg | Freq: Four times a day (QID) | INTRAMUSCULAR | Status: AC | PRN

## 2013-01-12 MED ORDER — HYDROMORPHONE 0.3 MG/ML IV SOLN
INTRAVENOUS | Status: DC
  Administered 2013-01-12: 23:00:00 via INTRAVENOUS
  Administered 2013-01-13: 0.399 mg via INTRAVENOUS
  Administered 2013-01-13: 0.2 mg via INTRAVENOUS
  Filled 2013-01-12: qty 25

## 2013-01-12 MED ORDER — NALOXONE HCL 0.4 MG/ML IJ SOLN: 0.4000 mg | INTRAMUSCULAR | Status: DC | PRN

## 2013-01-12 MED ORDER — BUPIVACAINE IN DEXTROSE 0.75-8.25 % IT SOLN
INTRATHECAL | Status: DC | PRN
  Administered 2013-01-12: 1.5 mL via INTRATHECAL

## 2013-01-12 MED ORDER — LACTATED RINGERS IV BOLUS (SEPSIS)
1000.0000 mL | Freq: Once | INTRAVENOUS | Status: AC
  Administered 2013-01-12: 1000 mL via INTRAVENOUS

## 2013-01-12 MED ORDER — DIPHENHYDRAMINE HCL 50 MG/ML IJ SOLN: 12.5000 mg | Freq: Four times a day (QID) | INTRAMUSCULAR | Status: DC | PRN

## 2013-01-12 MED ORDER — DIPHENHYDRAMINE HCL 50 MG/ML IJ SOLN: 12.5000 mg | INTRAMUSCULAR | Status: DC | PRN

## 2013-01-12 MED ORDER — BUPIVACAINE HCL (PF) 0.5 % IJ SOLN
INTRAMUSCULAR | Status: AC
  Filled 2013-01-12: qty 30

## 2013-01-12 MED ORDER — KETOROLAC TROMETHAMINE 60 MG/2ML IM SOLN
60.0000 mg | Freq: Once | INTRAMUSCULAR | Status: AC | PRN
  Administered 2013-01-12: 60 mg via INTRAMUSCULAR

## 2013-01-12 MED ORDER — FENTANYL CITRATE 0.05 MG/ML IJ SOLN
INTRAMUSCULAR | Status: AC
  Filled 2013-01-12: qty 2

## 2013-01-12 MED ORDER — BUPIVACAINE HCL (PF) 0.5 % IJ SOLN
INTRAMUSCULAR | Status: DC | PRN
  Administered 2013-01-12: 30 mL

## 2013-01-12 MED ORDER — LACTATED RINGERS IV SOLN
INTRAVENOUS | Status: DC
  Administered 2013-01-12 (×3): via INTRAVENOUS

## 2013-01-12 MED ORDER — PRENATAL MULTIVITAMIN CH
1.0000 | ORAL_TABLET | Freq: Every day | ORAL | Status: DC
  Administered 2013-01-13 – 2013-01-14 (×2): 1 via ORAL
  Filled 2013-01-12 (×2): qty 1

## 2013-01-12 MED ORDER — EPHEDRINE SULFATE 50 MG/ML IJ SOLN
INTRAMUSCULAR | Status: DC | PRN
  Administered 2013-01-12: 10 mg via INTRAVENOUS

## 2013-01-12 MED ORDER — ONDANSETRON HCL 4 MG/2ML IJ SOLN: 4.0000 mg | Freq: Three times a day (TID) | INTRAMUSCULAR | Status: DC | PRN

## 2013-01-12 MED ORDER — SIMETHICONE 80 MG PO CHEW: 80.0000 mg | CHEWABLE_TABLET | ORAL | Status: DC

## 2013-01-12 MED ORDER — ZOLPIDEM TARTRATE 5 MG PO TABS: 5.0000 mg | ORAL_TABLET | Freq: Every evening | ORAL | Status: DC | PRN

## 2013-01-12 MED ORDER — HYDROMORPHONE HCL PF 1 MG/ML IJ SOLN
1.0000 mg | Freq: Once | INTRAMUSCULAR | Status: AC
  Administered 2013-01-12: 1 mg via INTRAVENOUS

## 2013-01-12 MED ORDER — ONDANSETRON HCL 4 MG PO TABS: 4.0000 mg | ORAL_TABLET | ORAL | Status: DC | PRN

## 2013-01-12 MED ORDER — DIPHENHYDRAMINE HCL 25 MG PO CAPS: 25.0000 mg | ORAL_CAPSULE | Freq: Four times a day (QID) | ORAL | Status: DC | PRN

## 2013-01-12 MED ORDER — DIPHENHYDRAMINE HCL 50 MG/ML IJ SOLN: 25.0000 mg | INTRAMUSCULAR | Status: DC | PRN

## 2013-01-12 MED ORDER — DIPHENHYDRAMINE HCL 25 MG PO CAPS: 25.0000 mg | ORAL_CAPSULE | ORAL | Status: DC | PRN

## 2013-01-12 MED ORDER — SIMETHICONE 80 MG PO CHEW
80.0000 mg | CHEWABLE_TABLET | ORAL | Status: DC | PRN
  Administered 2013-01-13: 80 mg via ORAL

## 2013-01-12 MED ORDER — SENNOSIDES-DOCUSATE SODIUM 8.6-50 MG PO TABS
2.0000 | ORAL_TABLET | Freq: Every day | ORAL | Status: DC
  Administered 2013-01-13: 2 via ORAL

## 2013-01-12 MED ORDER — ONDANSETRON HCL 4 MG/2ML IJ SOLN
INTRAMUSCULAR | Status: AC
  Filled 2013-01-12: qty 2

## 2013-01-12 SURGICAL SUPPLY — 47 items
APL SKNCLS STERI-STRIP NONHPOA (GAUZE/BANDAGES/DRESSINGS) ×1
BENZOIN TINCTURE PRP APPL 2/3 (GAUZE/BANDAGES/DRESSINGS) ×2 IMPLANT
BINDER ABD UNIV 10 28-50 (GAUZE/BANDAGES/DRESSINGS) ×1 IMPLANT
BINDER ABD UNIV 12 45-62 (WOUND CARE) IMPLANT
BINDER ABDOM UNIV 10 (GAUZE/BANDAGES/DRESSINGS) ×2
BINDER ABDOMINAL 46IN 62IN (WOUND CARE)
CLAMP CORD UMBIL (MISCELLANEOUS) IMPLANT
CLOTH BEACON ORANGE TIMEOUT ST (SAFETY) ×2 IMPLANT
CONTAINER PREFILL 10% NBF 15ML (MISCELLANEOUS) ×4 IMPLANT
DRAPE LG THREE QUARTER DISP (DRAPES) ×4 IMPLANT
DRAPE WARM FLUID 44X44 (DRAPE) IMPLANT
DRSG OPSITE POSTOP 4X10 (GAUZE/BANDAGES/DRESSINGS) ×2 IMPLANT
DURAPREP 26ML APPLICATOR (WOUND CARE) ×2 IMPLANT
ELECT REM PT RETURN 9FT ADLT (ELECTROSURGICAL) ×2
ELECTRODE REM PT RTRN 9FT ADLT (ELECTROSURGICAL) ×1 IMPLANT
EXTRACTOR VACUUM M CUP 4 TUBE (SUCTIONS) IMPLANT
GLOVE BIO SURGEON STRL SZ7 (GLOVE) ×2 IMPLANT
GLOVE BIOGEL PI IND STRL 6.5 (GLOVE) ×1 IMPLANT
GLOVE BIOGEL PI IND STRL 7.0 (GLOVE) ×2 IMPLANT
GLOVE BIOGEL PI INDICATOR 6.5 (GLOVE) ×1
GLOVE BIOGEL PI INDICATOR 7.0 (GLOVE) ×2
GLOVE ECLIPSE 7.0 STRL STRAW (GLOVE) ×4 IMPLANT
GOWN PREVENTION PLUS XLARGE (GOWN DISPOSABLE) IMPLANT
GOWN STRL REIN XL XLG (GOWN DISPOSABLE) ×8 IMPLANT
KIT ABG SYR 3ML LUER SLIP (SYRINGE) IMPLANT
NEEDLE HYPO 22GX1.5 SAFETY (NEEDLE) ×2 IMPLANT
NEEDLE HYPO 25X5/8 SAFETYGLIDE (NEEDLE) IMPLANT
NS IRRIG 1000ML POUR BTL (IV SOLUTION) ×2 IMPLANT
PACK C SECTION WH (CUSTOM PROCEDURE TRAY) ×2 IMPLANT
PAD OB MATERNITY 4.3X12.25 (PERSONAL CARE ITEMS) ×2 IMPLANT
RTRCTR C-SECT PINK 25CM LRG (MISCELLANEOUS) IMPLANT
SPONGE LAP 18X18 X RAY DECT (DISPOSABLE) ×6 IMPLANT
SPONGE SURGIFOAM ABS GEL 12-7 (HEMOSTASIS) IMPLANT
STAPLER VISISTAT 35W (STAPLE) IMPLANT
STRIP CLOSURE SKIN 1/2X4 (GAUZE/BANDAGES/DRESSINGS) ×2 IMPLANT
SUT PDS AB 0 CTX 60 (SUTURE) IMPLANT
SUT PLAIN 0 NONE (SUTURE) ×2 IMPLANT
SUT PLAIN 2 0 XLH (SUTURE) ×2 IMPLANT
SUT SILK 0 TIES 10X30 (SUTURE) IMPLANT
SUT VIC AB 0 CT1 36 (SUTURE) ×6 IMPLANT
SUT VIC AB 3-0 CT1 27 (SUTURE) ×6
SUT VIC AB 3-0 CT1 TAPERPNT 27 (SUTURE) ×3 IMPLANT
SUT VIC AB 4-0 KS 27 (SUTURE) IMPLANT
SYR CONTROL 10ML LL (SYRINGE) ×2 IMPLANT
TOWEL OR 17X24 6PK STRL BLUE (TOWEL DISPOSABLE) ×2 IMPLANT
TRAY FOLEY CATH 14FR (SET/KITS/TRAYS/PACK) ×2 IMPLANT
WATER STERILE IRR 1000ML POUR (IV SOLUTION) ×2 IMPLANT

## 2013-01-12 NOTE — H&P (Signed)
Discussed with pt and her partner in detail that a bilateral salpingectomy is permanent.  They both confirm the desire to have the procedure and understand that the goal is for sterilization. Attestation of Attending Supervision of Fellow: Evaluation and management procedures were performed by the Fellow under my supervision and collaboration.  I have reviewed the Fellow's note and chart, and I agree with the management and plan.

## 2013-01-12 NOTE — Op Note (Signed)
Wendy Blevins PROCEDURE DATE: 01/12/2013  PREOPERATIVE DIAGNOSES: Intrauterine pregnancy at  [redacted]w[redacted]d weeks gestation; elective repeat; desire for sterilization  POSTOPERATIVE DIAGNOSES: The same  PROCEDURE: Repeat Low Transverse Cesarean Section, bilateral salpingectomy for sterilization.  SURGEON:  Dr. Erin Fulling  ASSISTANT:  Dr. Rulon Abide  ANESTHESIOLOGIST: Dr. Cristela Blue  INDICATIONS: Wendy Blevins is a 27 y.o. G2P1001 at [redacted]w[redacted]d weeks gestation here for cesarean section with sterilization secondary to the indications listed under preoperative diagnosis; please see preoperative note for further details.  The risks of cesarean section were discussed with the patient including but were not limited to: bleeding which may require transfusion or reoperation; infection which may require antibiotics; injury to bowel, bladder, ureters or other surrounding organs; injury to the fetus; need for additional procedures including hysterectomy in the event of a life-threatening hemorrhage;incisional problems, thromboembolic phenomenon and other postoperative/anesthesia complications. Reviewed with patient in detail the risks of regret with a tubal sterilization.  Confirmed with the patient that a tubal ligation is meant to be permanent and reviewed the risk of failure as 3-07/998.  Patient confirmed that she does want a permanent sterilization with her primary c-section. The patient concurred with the proposed plan, giving informed written consent for the procedure.    FINDINGS:  Viable female infant in cephalic presentation.  Apgars 9 and 9.  Clear amniotic fluid.  Intact placenta, three vessel cord.  Normal uterus, fallopian tubes and ovaries bilaterally.  PROCEDURE IN DETAIL:  The patient preoperatively received intravenous antibiotics and had sequential compression devices applied to her lower extremities.  She was then taken to the operating room where spinal anesthesia was administered and was found to be  adequate. She was then placed in a dorsal supine position with a leftward tilt, and prepped and draped in a sterile manner.  A foley catheter was placed into her bladder and attached to constant gravity.  After an adequate timeout was performed, a Pfannenstiel skin incision was made with scalpel and carried through to the underlying layer of fascia. The fascia was incised in the midline, and this incision was extended bilaterally using the Mayo scissors.  Kocher clamps were applied to the superior aspect of the fascial incision and the underlying rectus muscles were dissected off bluntly. A similar process was carried out on the inferior aspect of the fascial incision. The rectus muscles were separated in the midline bluntly and the peritoneum was entered bluntly. Attention was turned to the lower uterine segment where significant adhesions were found.  A bladder flap was created and a low transverse hysterotomy incision was made with a scalpel and extended bilaterally bluntly.  The infant was successfully delivered, the cord was clamped and cut and the infant was handed over to awaiting neonatology team. Uterine massage was then administered, and the placenta delivered intact with a three-vessel cord. The uterus was then cleared of clot and debris.  The hysterotomy was closed with 0 Vicryl in a running locked fashion. Several areas of bleeding were over sewn again for hemostasis.  The Fallopian tubes were identified bilaterally.  Attention was turned to the left fallopian tube where the fimbriae were identified and a  segment of the tube was clamped with a kelly and cut. 2-0 gut was used to tie off the remaining fallopian tube with a henley stitch. The same approach was taken with the right fallopian tube.  There was no bleeding.  The uterus was returned to the pelvis. The pelvis was cleared of all clot and debris. Hemostasis  was confirmed on all surfaces.  The peritoneum and the muscles were reapproximated using 0  Vicryl in 1 interrupted suture. The fascia was then closed using 0 Vicryl in a running fashion.  The subcutaneous layer was irrigated, then reapproximated with 3-0 vicryl in a running fashion, and the skin was closed with a 4-0 Vicryl subcuticular stitch. The incision was injected with 30 cc of  0.5% Marcaine. The patient tolerated the procedure well. Sponge, lap, instrument and needle counts were correct x 2.  She was taken to the recovery room awake and in stable condition.

## 2013-01-12 NOTE — H&P (Signed)
Wendy Blevins is a 27 y.o. female G2P1001 with IUP at [redacted]w[redacted]d presenting for repeat c/s at [redacted]w[redacted]d with bilateral salpingectomy.   Pt denies any regular contraction, no vb, lof. +FM.  Having a baby girl - chloe.  Previous c/s  at term for failure to dilate. No ha, vision changes, sob, cp, lee.   [facility] 28 wks. Complicated by gHTN which is most consistent with possible chronic HTN. Prot/cr ration of 0.12 in August.   Prenatal History/Complications:  Past Medical History: Past Medical History  Diagnosis Date  . Stomach ulcer   . GERD (gastroesophageal reflux disease)     Past Surgical History: Past Surgical History  Procedure Laterality Date  . Cesarean section  2005    failure to progress  . Cyst removal trunk      Obstetrical History: OB History   Grav Para Term Preterm Abortions TAB SAB Ect Mult Living   2 1 1       1      Social History: History   Social History  . Marital Status: Single    Spouse Name: N/A    Number of Children: N/A  . Years of Education: N/A   Social History Main Topics  . Smoking status: Never Smoker   . Smokeless tobacco: None  . Alcohol Use: No     Comment: occasional prior to pregnancy  . Drug Use: No  . Sexual Activity: Yes   Other Topics Concern  . None   Social History Narrative  . None    Family History: Family History  Problem Relation Age of Onset  . Diabetes Maternal Grandmother     Allergies: No Known Allergies  Prescriptions prior to admission  Medication Sig Dispense Refill  . acetaminophen (TYLENOL) 500 MG tablet Take 500 mg by mouth daily as needed for pain.      . famotidine (PEPCID) 20 MG tablet Take 1 tablet (20 mg total) by mouth 2 (two) times daily.  30 tablet  3  . Prenatal Vit-Fe Fumarate-FA (PRENATAL MULTIVITAMIN) TABS Take 1 tablet by mouth daily at 12 noon.         Review of Systems   Constitutional: Negative for fever, chills, weight loss, malaise/fatigue and diaphoresis.  HENT: Negative  for hearing loss, ear pain, nosebleeds, congestion, sore throat, neck pain, tinnitus and ear discharge.   Eyes: Negative for blurred vision, double vision, photophobia, pain, discharge and redness.  Respiratory: Negative for cough, hemoptysis, sputum production, shortness of breath, wheezing and stridor.   Cardiovascular: Negative for chest pain, palpitations, orthopnea,  leg swelling  Gastrointestinal: Negative for heartburn, nausea, vomiting, diarrhea, constipation, blood in stool Genitourinary: Negative for dysuria, urgency, frequency, hematuria and flank pain.  Musculoskeletal: Negative for myalgias, back pain, joint pain and falls.  Skin: Negative for itching and rash.  Neurological: Negative for dizziness, tingling, tremors, sensory change, speech change, focal weakness, seizures, loss of consciousness, weakness and headaches.  Endo/Heme/Allergies: Negative for environmental allergies and polydipsia. Does not bruise/bleed easily.  Psychiatric/Behavioral: Negative for depression, suicidal ideas, hallucinations, memory loss and substance abuse. The patient is not nervous/anxious and does not have insomnia.       Blood pressure 171/97, pulse 63, temperature 97.9 F (36.6 C), temperature source Oral, resp. rate 18, last menstrual period 03/30/2012, SpO2 98.00%. General appearance: alert, cooperative and no distress Lungs: clear to auscultation bilaterally Heart: regular rate and rhythm Abdomen: soft, non-tender; bowel sounds normal. Previous c/s scar well healed Extremities: Homans sign is negative,  no sign of DVT DTR's 2+ Presentation: cephalic Fetal monitoring142 on doppler   Prenatal labs: ABO, Rh: --/--/O POS, O POS (10/03 1400) Antibody: NEG (10/03 1400) Rubella:   RPR: NON REAC (07/23 1625)  HBsAg: NEGATIVE (07/23 1625)  HIV: NON REACTIVE (07/23 1625)  GBS:    1 hr Glucola 77 Genetic screening  Too late Anatomy US normal   Assessment: Wendy Blevins is a 27 y.o.  G2P1001 with an IUP at [redacted]w[redacted]d presenting for elective repeat c/s at term with bilateral tubal ligation.   Plan:  The risks of cesarean section discussed with the patient included but were not limited to: bleeding which may require transfusion or reoperation; infection which may require antibiotics; injury to bowel, bladder, ureters or other surrounding organs; injury to the fetus; need for additional procedures including hysterectomy in the event of a life-threatening hemorrhage; placental abnormalities wth subsequent pregnancies, incisional problems, thromboembolic phenomenon and other postoperative/anesthesia complications. The patient concurred with the proposed plan, giving informed written consent for the procedure.   Patient has been NPO since midnight she will remain NPO for procedure. Anesthesia and OR aware.   Patient desires permanent sterilization.  Other reversible forms of contraception were discussed with patient; she declines all other modalities. Risks of procedure discussed with patient including but not limited to: risk of regret, permanence of method, bleeding, infection, injury to surrounding organs and need for additional procedures.  Failure risk of 1-2 % with increased risk of ectopic gestation if pregnancy occurs was also discussed with patient.  Patient verbalized understanding of these risks and wants to proceed with sterilization.  Written informed consent obtained.  To OR when ready.      Kassadee Carawan, Redmond Baseman, MD 01/12/2013, 9:27 AM

## 2013-01-12 NOTE — Op Note (Signed)
Agree with above.  I was present and scrubbed for the entire procedure. Areej Tayler L. Harraway-Smith, M.D., Evern Core

## 2013-01-12 NOTE — Anesthesia Preprocedure Evaluation (Signed)
Anesthesia Evaluation  Patient identified by MRN, date of birth, ID band Patient awake    Reviewed: Allergy & Precautions, H&P , Patient's Chart, lab work & pertinent test results  Airway Mallampati: II TM Distance: >3 FB Neck ROM: full    Dental no notable dental hx.    Pulmonary  breath sounds clear to auscultation  Pulmonary exam normal       Cardiovascular Exercise Tolerance: Good Rhythm:regular Rate:Normal     Neuro/Psych    GI/Hepatic GERD-  ,  Endo/Other  Morbid obesity  Renal/GU      Musculoskeletal   Abdominal   Peds  Hematology   Anesthesia Other Findings   Reproductive/Obstetrics                           Anesthesia Physical Anesthesia Plan  ASA: III  Anesthesia Plan: Spinal   Post-op Pain Management:    Induction:   Airway Management Planned:   Additional Equipment:   Intra-op Plan:   Post-operative Plan:   Informed Consent: I have reviewed the patients History and Physical, chart, labs and discussed the procedure including the risks, benefits and alternatives for the proposed anesthesia with the patient or authorized representative who has indicated his/her understanding and acceptance.   Dental Advisory Given  Plan Discussed with: CRNA  Anesthesia Plan Comments: (Lab work confirmed with CRNA in room. Platelets okay. Discussed spinal anesthetic, and patient consents to the procedure:  included risk of possible headache,backache, failed block, allergic reaction, and nerve injury. This patient was asked if she had any questions or concerns before the procedure started. )        Anesthesia Quick Evaluation

## 2013-01-12 NOTE — Transfer of Care (Signed)
Immediate Anesthesia Transfer of Care Note  Patient: Wendy Blevins  Procedure(s) Performed: Procedure(s): Repeat CESAREAN SECTION with delivery of baby    girl @1201 ; Apgars 9/9  . Bilateral salpingectomy  (N/A)  Patient Location: PACU  Anesthesia Type:Spinal  Level of Consciousness: awake, alert  and oriented  Airway & Oxygen Therapy: Patient Spontanous Breathing  Post-op Assessment: Report given to PACU RN and Post -op Vital signs reviewed and stable  Post vital signs: Reviewed and stable  Complications: No apparent anesthesia complications

## 2013-01-12 NOTE — Progress Notes (Signed)
Patient verbalizes that she feels better.  Her pain level is currently at a 0.  No blurred vision

## 2013-01-12 NOTE — Progress Notes (Addendum)
Was called by nurse tech around 2135 stating that the patient was "feeling dizzy and sick"  Upon entering the room, she was in the high fowlers position with her head lying against her pillow stating that she felt dizzy, had blurred vision, and that her stomach was hurting.  When I was previously in the room less than an hour prior, her pain level was at a 0.  She verbalizes that the lower part of her stomach hurts, and is burning and squeezing. She also verbalized nausea. VS obtained :  BP 68/40 manual, pulse 51, T 98.4, O2 97% on RA. Bleeding small, uterus firm and one below the umbilicus, small shadow of drainage in the left lower side of her pressure dressing. She rates her pain at 9.  Dr. Reola Calkins notified and new orders received for a 1000cc bolus.  After calling Dr. Reola Calkins, she came to the patients room to evaluate her.

## 2013-01-12 NOTE — Anesthesia Procedure Notes (Signed)
Spinal  Patient location during procedure: OR Start time: 01/12/2013 11:30 AM Staffing Anesthesiologist: Shavana Calder A. Performed by: anesthesiologist  Preanesthetic Checklist Completed: patient identified, site marked, surgical consent, pre-op evaluation, timeout performed, IV checked, risks and benefits discussed and monitors and equipment checked Spinal Block Patient position: sitting Prep: site prepped and draped and DuraPrep Patient monitoring: heart rate, cardiac monitor, continuous pulse ox and blood pressure Approach: midline Location: L3-4 Injection technique: single-shot Needle Needle type: Sprotte  Needle gauge: 24 G Needle length: 9 cm Needle insertion depth: 5.5 cm Assessment Sensory level: T2 Additional Notes Patient tolerated procedure well. Adequate sensory level.

## 2013-01-13 ENCOUNTER — Encounter: Payer: Self-pay | Admitting: Family Medicine

## 2013-01-13 ENCOUNTER — Encounter (HOSPITAL_COMMUNITY): Payer: Self-pay | Admitting: Obstetrics & Gynecology

## 2013-01-13 LAB — CBC
HCT: 26.3 % — ABNORMAL LOW (ref 36.0–46.0)
Hemoglobin: 9.2 g/dL — ABNORMAL LOW (ref 12.0–15.0)
MCH: 29.1 pg (ref 26.0–34.0)
MCV: 83.2 fL (ref 78.0–100.0)
Platelets: 167 10*3/uL (ref 150–400)
RBC: 3.16 MIL/uL — ABNORMAL LOW (ref 3.87–5.11)

## 2013-01-13 NOTE — Anesthesia Postprocedure Evaluation (Signed)
Anesthesia Post Note  Patient: Wendy Blevins  Procedure(s) Performed: Procedure(s) (LRB): Repeat CESAREAN SECTION with delivery of baby    girl @1201 ; Apgars 9/9  . Bilateral salpingectomy  (N/A)  Anesthesia type: SAB  Patient location: Mother/Baby  Post pain: Pain level controlled  Post assessment: Post-op Vital signs reviewed  Last Vitals:  Filed Vitals:   01/13/13 0605  BP:   Pulse:   Temp:   Resp: 20    Post vital signs: Reviewed  Level of consciousness: awake  Complications: No apparent anesthesia complications

## 2013-01-13 NOTE — Progress Notes (Signed)
PCA set up done on 10/6 at 2305 Verified by Hart Carwin RN

## 2013-01-13 NOTE — Progress Notes (Signed)
UR chart review completed.  

## 2013-01-13 NOTE — Progress Notes (Signed)
Subjective: Postpartum Day 1: Cesarean Delivery with b/l salpingectomy  Pt had significant pain overnight that required addition of a PCA. Since then though has been well controlled.  Feels great this AM.  Has been up moving around and eating. Foley to be removed this AM. Passing gas but no bowel movement.   Objective: Vital signs in last 24 hours: Temp:  [97.6 F (36.4 C)-98.4 F (36.9 C)] 97.8 F (36.6 C) (10/07 0330) Pulse Rate:  [48-94] 94 (10/07 0335) Resp:  [15-20] 20 (10/07 0605) BP: (68-171)/(40-97) 119/78 mmHg (10/07 0335) SpO2:  [96 %-99 %] 97 % (10/07 0605) Weight:  [102.059 kg (225 lb)] 102.059 kg (225 lb) (10/06 1500)  Physical Exam:  General: alert, cooperative and no distress Lochia: appropriate Uterine Fundus: firm Incision: yesterday she oozed through the honey comb and a pressure dressing was applied. Currently with slight amount of dried blood marked on right side but no signifcant drainage.  DVT Evaluation: No evidence of DVT seen on physical exam.   Recent Labs  01/12/13 2155 01/13/13 0210  HGB 9.5* 9.2*  HCT 27.7* 26.3*    Assessment/Plan: Status post Cesarean section. Now improved pain control.   D/c foley D/c pca Hgb stable overnight.  Cont to monitor Breast feeding  Continue current care.  Shristi Scheib L 01/13/2013, 7:19 AM

## 2013-01-14 MED ORDER — WITCH HAZEL-GLYCERIN EX PADS: 1.0000 "application " | MEDICATED_PAD | CUTANEOUS | Status: DC | PRN

## 2013-01-14 MED ORDER — SENNOSIDES-DOCUSATE SODIUM 8.6-50 MG PO TABS: 2.0000 | ORAL_TABLET | Freq: Every day | ORAL | Status: DC

## 2013-01-14 MED ORDER — LANOLIN HYDROUS EX OINT: 1.0000 "application " | TOPICAL_OINTMENT | CUTANEOUS | Status: DC | PRN

## 2013-01-14 MED ORDER — DIBUCAINE 1 % RE OINT: 1.0000 "application " | TOPICAL_OINTMENT | RECTAL | Status: DC | PRN

## 2013-01-14 MED ORDER — OXYCODONE-ACETAMINOPHEN 5-325 MG PO TABS
1.0000 | ORAL_TABLET | ORAL | Status: DC | PRN
Start: 2013-01-14 — End: 2013-02-13

## 2013-01-14 NOTE — Clinical Social Work Maternal (Signed)
    LATE ENTRY FROM 01/13/13:  Clinical Social Work Department PSYCHOSOCIAL ASSESSMENT - MATERNAL/CHILD 01/14/2013  Patient:  Wendy Blevins, Wendy Blevins  Account Number:  [id number]  Admit Date:  01/12/2013  Marjo Bicker Name:   Abran Cantor    Clinical Social Worker:  Nobie Putnam, LCSW   Date/Time:  01/13/2013 01:00 PM  Date Referred:  01/13/2013   Referral source  CN     Referred reason  [facility]   Other referral source:    I:  FAMILY / HOME ENVIRONMENT Child's legal guardian:  PARENT  Guardian - Name Guardian - Age Guardian - Address  Yarah Fuente [address]; [place], [place] [address]  Trenton Founds 29 (same as above)   Other household support members/support persons Name Relationship DOB   SON 63 years old   Other support:    II  PSYCHOSOCIAL DATA Information Source:  Patient Interview  Event organiser Employment:   Surveyor, quantity resources:  [employer] If Medicaid - County:  [employer] Other  Chemical engineer / Grade:   Maternity Care Coordinator / Child Services Coordination / Early Interventions:  Cultural issues impacting care:    III  STRENGTHS Strengths  Adequate Resources  Home prepared for Child (including basic supplies)  Supportive family/friends   Strength comment:    IV  RISK FACTORS AND CURRENT PROBLEMS Current Problem:  YES   Risk Factor & Current Problem Patient Issue Family Issue Risk Factor / Current Problem Comment  Other - See comment Y N [facility] @ 28 weeks    V  SOCIAL WORK ASSESSMENT CSW met with pt to assess reason for [facility] @ 28 weeks.  Pt was not able to establish [facility] due to lack of insurance (Medicaid).  Once pt's Medicaid benefits were approved, she started [facility].  She denies any illegal substance use & verbalized understanding of hospital drug testing policy. UDS is negative, meconium results are pending.  She has all the necessary supplies for the infant & good family support.  FOB at the bedside & appears to be very supportive.  CSW will  continue to monitor drug screen results & make a referral if needed.      VI SOCIAL WORK PLAN Social Work Plan  No Further Intervention Required / No Barriers to Discharge   Type of pt/family education:   If child protective services report - county:   If child protective services report - date:   Information/referral to community resources comment:   Other social work plan:

## 2013-01-14 NOTE — Discharge Summary (Cosign Needed)
Obstetric Discharge Summary Reason for Admission: cesarean section Prenatal Procedures: NST and ultrasound Intrapartum Procedures: cesarean: low cervical, transverse and tubal ligation Postpartum Procedures: none Complications-Operative and Postpartum: none Hemoglobin  Date Value Range Status  01/13/2013 9.2* 12.0 - 15.0 g/dL Final     HCT  Date Value Range Status  01/13/2013 26.3* 36.0 - 46.0 % Final    Physical Exam:  General: alert, cooperative, appears stated age and no distress Lochia: appropriate Uterine Fundus: firm Incision: healing well, no significant drainage, no dehiscence, no significant erythema DVT Evaluation: No evidence of DVT seen on physical exam. Negative Homan's sign. No cords or calf tenderness. No significant calf/ankle edema.  Discharge Diagnoses: Term Pregnancy-delivered  Discharge Information: Date: 01/14/2013 Activity: unrestricted Diet: routine Medications: Ibuprofen Condition: stable Instructions: refer to practice specific booklet Discharge to: home  Patient confirms a good appetite. She says there is very little vaginal bleeding. She has not yet had a BM but has been taking stool softeners. Patient is able to walk and wants to go home.   Newborn Data: Live born female  Birth Weight: 5 lb 8 oz (2495 g) APGAR: 9, 9 Breast feeding  MOC: BTL   Home with mother.  Bing Plume 01/14/2013, 7:37 AM  I spoke with and examined patient and agree with PA-S's note and plan of care.  Tawana Scale, MD Ob Fellow 01/14/2013 2:11 PM

## 2013-01-14 NOTE — Anesthesia Postprocedure Evaluation (Signed)
  Anesthesia Post-op Note  Patient: Wendy Blevins  Procedure(s) Performed: Procedure(s): Repeat CESAREAN SECTION with delivery of baby    girl @1201 ; Apgars 9/9  . Bilateral salpingectomy  (N/A)  Patient Location: PACU  Anesthesia Type:Spinal  Level of Consciousness: awake, alert  and oriented  Airway and Oxygen Therapy: Patient Spontanous Breathing  Post-op Pain: none  Post-op Assessment: Post-op Vital signs reviewed, Patient's Cardiovascular Status Stable, Respiratory Function Stable, Patent Airway, No signs of Nausea or vomiting, Pain level controlled, No headache and No backache  Post-op Vital Signs: Reviewed and stable  Complications: No apparent anesthesia complications

## 2013-01-29 ENCOUNTER — Encounter: Payer: Self-pay | Admitting: *Deleted

## 2013-02-13 ENCOUNTER — Ambulatory Visit (INDEPENDENT_AMBULATORY_CARE_PROVIDER_SITE_OTHER): Payer: Medicaid Other | Admitting: Medical

## 2013-02-13 ENCOUNTER — Ambulatory Visit: Payer: Medicaid Other | Admitting: Family Medicine

## 2013-02-13 ENCOUNTER — Encounter: Payer: Self-pay | Admitting: Family Medicine

## 2013-02-13 DIAGNOSIS — A630 Anogenital (venereal) warts: Secondary | ICD-10-CM

## 2013-02-13 DIAGNOSIS — I10 Essential (primary) hypertension: Secondary | ICD-10-CM

## 2013-02-13 MED ORDER — ENALAPRIL MALEATE 2.5 MG PO TABS: 2.5000 mg | ORAL_TABLET | Freq: Every day | ORAL | Status: AC

## 2013-02-13 NOTE — Progress Notes (Signed)
Patient ID: Wendy Blevins, female   DOB: [birth year], 30 y.o.   MRN: [id number] Subjective:     Wendy Blevins is a 27 y.o. female who presents for a postpartum visit. She is 4 weeks postpartum following a low cervical transverse Cesarean section. I have fully reviewed the prenatal and intrapartum course. The delivery was at 39.2 gestational weeks. Outcome: repeat cesarean section, low transverse incision. Anesthesia: spinal. Postpartum course has been normal. Baby's course has been normal. Baby is feeding by breast. Bleeding brown. Bowel function is abnormal: constipation. Patient restarted Colace recently which seems to be helping. . Bladder function is normal. Patient is sexually active. Contraception method is tubal ligation. Postpartum depression screening: negative. Patient states that she has genital warts that presented first during her pregnancy that she would like evaluated today.   The following portions of the patient's history were reviewed and updated as appropriate: allergies, current medications, past family history, past medical history, past social history, past surgical history and problem list.  Review of Systems Pertinent items are noted in HPI.   Objective:    BP 141/89  Pulse 71  Temp(Src) 97.4 F (36.3 C) (Oral)  Ht 5\' 4"  (1.626 m)  Wt 198 lb 4.8 oz (89.948 kg)  BMI 34.02 kg/m2  Breastfeeding? Yes  General:  alert and cooperative   Breasts:  not performed  Lungs: clear to auscultation bilaterally  Heart:  regular rate and rhythm, S1, S2 normal, no murmur, click, rub or gallop  Abdomen: soft, non-tender; bowel sounds normal; no masses,  no organomegaly   Vulva:  positive for condyloma  Vagina: not evaluated  Cervix:  not evaluated  Corpus: normal size, contour, position, consistency, mobility, non-tender  Adnexa:  not evaluated  Rectal Exam: Not performed.        Patient identified, informed consent signed and copy in chart, time out performed.    Areas of  typical appearing genital warts noted at 5 o'clock and 7 o'clock .  TCA applied until warts had white appearance.    Patient tolerated the procedure well.    Post procedure instructions given and patient told to wash area in thirty minutes.  Return in 2 weeks for next treatment.     Assessment:     Normal postpartum exam. Pap smear not done at today's visit.  Patient will be due for next pap smear 10/2013. If normal then can go to q 3 years. History of abnormal pap smear many years ago with cryotherapy.   Plan:    1. Contraception: tubal ligation 2. TCA treatment today for genital warts. Patient will return in 2 weeks for next treatment.  3. Rx for Enalapril sent to patient's pharmacy. Patient encouraged to establish PCP for further management. Patient will return for BP check in 2 week.  3. Follow up in: 1 week for BP check or as needed.

## 2013-02-13 NOTE — Patient Instructions (Signed)
Genital Warts Genital warts are a sexually transmitted infection. They may appear as small bumps on the tissues of the genital area. CAUSES  Genital warts are caused by a virus called human papillomavirus (HPV). HPV is the most common sexually transmitted disease (STD) and infection of the sex organs. This infection is spread by having unprotected sex with an infected person. It can be spread by vaginal, anal, and oral sex. Many people do not know they are infected. They may be infected for years without problems. However, even if they do not have problems, they can unknowingly pass the infection to their sexual partners. SYMPTOMS   Itching and irritation in the genital area.  Warts that bleed.  Painful sexual intercourse. DIAGNOSIS  Warts are usually recognized with the naked eye on the vagina, vulva, perineum, anus, and rectum. Certain tests can also diagnose genital warts, such as:  A Pap test.  A tissue sample (biopsy) exam.  Colposcopy. A magnifying tool is used to examine the vagina and cervix. The HPV cells will change color when certain solutions are used. TREATMENT  Warts can be removed by:  Applying certain chemicals, such as cantharidin or podophyllin.  Liquid nitrogen freezing (cryotherapy).  Immunotherapy with candida or trichophyton injections.  Laser treatment.  Burning with an electrified probe (electrocautery).  Interferon injections.  Surgery. PREVENTION  HPV vaccination can help prevent HPV infections that cause genital warts and that cause cancer of the cervix. It is recommended that the vaccination be given to people between the ages 9 to 26 years old. The vaccine might not work as well or might not work at all if you already have HPV. It should not be given to pregnant women. HOME CARE INSTRUCTIONS   It is important to follow your caregiver's instructions. The warts will not go away without treatment. Repeat treatments are often needed to get rid of warts.  Even after it appears that the warts are gone, the normal tissue underneath often remains infected.  Do not try to treat genital warts with medicine used to treat hand warts. This type of medicine is strong and can burn the skin in the genital area, causing more damage.  Tell your past and current sexual partner(s) that you have genital warts. They may be infected also and need treatment.  Avoid sexual contact while being treated.  Do not touch or scratch the warts. The infection may spread to other parts of your body.  Women with genital warts should have a cervical cancer check (Pap test) at least once a year. This type of cancer is slow-growing and can be cured if found early. Chances of developing cervical cancer are increased with HPV.  Inform your obstetrician about your warts in the event of pregnancy. This virus can be passed to the baby's respiratory tract. Discuss this with your caregiver.  Use a condom during sexual intercourse. Following treatment, the use of condoms will help prevent reinfection.  Ask your caregiver about using over-the-counter anti-itch creams. SEEK MEDICAL CARE IF:   Your treated skin becomes red, swollen, or painful.  You have a fever.  You feel generally ill.  You feel little lumps in and around your genital area.  You are bleeding or have painful sexual intercourse. MAKE SURE YOU:   Understand these instructions.  Will watch your condition.  Will get help right away if you are not doing well or get worse. Document Released: 03/23/2000 Document Revised: 06/18/2011 Document Reviewed: 10/02/2010 [employer] Patient Information 2014 [employer].  

## 2013-02-20 ENCOUNTER — Encounter: Payer: Self-pay | Admitting: *Deleted

## 2013-03-02 ENCOUNTER — Encounter: Payer: Self-pay | Admitting: Obstetrics & Gynecology

## 2013-03-02 ENCOUNTER — Ambulatory Visit (INDEPENDENT_AMBULATORY_CARE_PROVIDER_SITE_OTHER): Payer: Medicaid Other | Admitting: Obstetrics & Gynecology

## 2013-03-02 ENCOUNTER — Other Ambulatory Visit (HOSPITAL_COMMUNITY)
Admission: RE | Admit: 2013-03-02 | Discharge: 2013-03-02 | Disposition: A | Discharge status: Home / Self Care | Payer: Medicaid Other | Source: Ambulatory Visit | Attending: Obstetrics & Gynecology | Admitting: Obstetrics & Gynecology

## 2013-03-02 VITALS — BP 145/88 | HR 62 | Ht 64.0 in | Wt 194.9 lb

## 2013-03-02 DIAGNOSIS — A630 Anogenital (venereal) warts: Secondary | ICD-10-CM

## 2013-03-02 DIAGNOSIS — N9089 Other specified noninflammatory disorders of vulva and perineum: Secondary | ICD-10-CM | POA: Insufficient documentation

## 2013-03-02 NOTE — Patient Instructions (Signed)
Human Papillomavirus (HPV) Gardasil Vaccine What You Need to Know WHAT IS HPV?  Genital human papillomavirus (HPV) is the most common sexually transmitted virus in the [place]. More than half of sexually active men and women are infected with HPV at some time in their lives.  About 20 million Americans are currently infected, and about 6 million more get infected each year. HPV is usually spread through sexual contact.  Most HPV infections do not cause any symptoms and go away on their own. But HPV can cause cervical cancer in women. Cervical cancer is the 2nd leading cause of cancer deaths among women around the world. In the [place], about 12,000 women get cervical cancer every year and about 4,000 are expected to die from it.  HPV is also associated with several less common cancers, such as vaginal and vulvar cancers in women, and anal and oropharyngeal (back of the throat, including base of tongue and tonsils) cancers in both men and women. HPV can also cause genital warts and warts in the throat.  There is no cure for HPV infection, but some of the problems it causes can be treated. HPV VACCINE: WHY GET VACCINATED?  The HPV vaccine you are getting is 1 of 2 vaccines that can be given to prevent HPV. It may be given to both males and females.  This vaccine can prevent most cases of cervical cancer in females, if it is given before exposure to the virus. In addition, it can prevent vaginal and vulvar cancer in females, and genital warts and anal cancer in both males and females.  Protection from HPV vaccine is expected to be long-lasting. But vaccination is not a substitute for cervical cancer screening. Women should still get regular Pap tests. WHO SHOULD GET THIS HPV VACCINE AND WHEN? HPV vaccine is given as a 3-dose series.  1st Dose: Now.  2nd Dose: 1 to 2 months after Dose 1.  3rd Dose: 6 months after Dose 1. Additional (booster) doses are not recommended. Routine  Vaccination This HPV vaccine is recommended for girls and boys 71 or 29 years of age. It may be given starting at age 58. Why is HPV vaccine recommended at 40 or 29 years of age?  HPV infection is easily acquired, even with only one sex partner. That is why it is important to get HPV vaccine before any sexual conact takes place. Also, response to the vaccine is better at this age than at older ages. Catch-Up Vaccination This vaccine is recommended for the following people who have not completed the 3-dose series:   Females 13 through 29 years of age.  Males 13 through 29 years of age. This vaccine may be given to men 22 through 29 years of age who have not completed the 3-dose series. It is recommended for men through age 82 who have sex with men or whose immune system is weakened because of HIV infection, other illness, or medications.  HPV vaccine may be given at the same time as other vaccines. SOME PEOPLE SHOULD NOT GET HPV VACCINE OR SHOULD WAIT  Anyone who has ever had a life-threatening allergic reaction to any other component of HPV vaccine, or to a previous dose of HPV vaccine, should not get the vaccine. Tell your doctor if the person getting vaccinated has any severe allergies, including an allergy to yeast.  HPV vaccine is not recommended for pregnant women. However, receiving HPV vaccine when pregnant is not a reason to consider terminating the pregnancy.  Women who are breastfeeding may get the vaccine.  People who are mildly ill when a dose of HPV is planned can still be vaccinated. People with a moderate or severe illness should wait until they are better. WHAT ARE THE RISKS FROM THIS VACCINE?  This HPV vaccine has been used in the [place] and around the world for about 6 years and has been very safe.  However, any medicine could possibly cause a serious problem, such as a severe allergic reaction. The risk of any vaccine causing a serious injury, or death, is extremely  small.  Life-threatening allergic reactions from vaccines are very rare. If they do occur, it would be within a few minutes to a few hours after the vaccination. Several mild to moderate problems are known to occur with HPV vaccine. These do not last long and go away on their own.  Reactions in the arm where the shot was given:  Pain (about 8 people in 10).  Redness or swelling (about 1 person in 4).  Fever:  Mild (100 F or 37.8 C) (about 1 person in 10).  Moderate (102 F or 38.9 C) (about 1 person in 58).  Other problems:  Headache (about 1 person in 3).  Fainting: Brief fainting spells and related symptoms (such as jerking movements) can happen after any medical procedure, including vaccination. Sitting or lying down for about 15 minutes after a vaccination can help prevent fainting and injuries caused by falls. Tell your doctor if the patient feels dizzy or lightheaded, or has vision changes or ringing in the ears.  Like all vaccines, HPV vaccines will continue to be monitored for unusual or severe problems. WHAT IF THERE IS A SERIOUS REACTION? What should I look for?  Any unusual condition, such as a high fever or unusual behavior. Signs of a serious allergic reaction can include difficulty breathing, hoarseness or wheezing, hives, paleness, weakness, a fast heartbeat, or dizziness. What should I do?  Call a doctor, or get the person to a doctor right away.  Tell your doctor what happened, the date and time it happened, and when the vaccination was given.  Ask your doctor, nurse, or health department to report the reaction by filing a Vaccine Adverse Event Reporting System (VAERS) form. Or, you can file this report through the VAERS website at www.vaers.LAgents.no or by calling 1-470-826-2336. VAERS does not provide medical advice. THE NATIONAL VACCINE INJURY COMPENSATION PROGRAM  The National Vaccine Injury Compensation Program (VICP) is a federal program that was created  to compensate people who may have been injured by certain vaccines.  Persons who believe they may have been injured by a vaccine can learn about the program and about filing a claim by calling 1-581-649-4252 or visiting the VICP website at SpiritualWord.at HOW CAN I LEARN MORE?  Ask your doctor.  Call your local or state health department.  Contact the Centers for Disease Control and Prevention (CDC):  Call 9151930047 (1-800-CDC-INFO)  or  Visit CDC's website at PicCapture.uy CDC Human Papillomavirus (HPV) Gardasil (Interim) 08/24/11 Document Released: 01/21/2006 Document Revised: 12/19/2011 Document Reviewed: 07/15/2012 ExitCare Patient Information 2014 [place], [place]. Genital Warts Genital warts are a sexually transmitted infection. They may appear as small bumps on the tissues of the genital area. CAUSES  Genital warts are caused by a virus called human papillomavirus (HPV). HPV is the most common sexually transmitted disease (STD) and infection of the sex organs. This infection is spread by having unprotected sex with an infected person. It can  be spread by vaginal, anal, and oral sex. Many people do not know they are infected. They may be infected for years without problems. However, even if they do not have problems, they can unknowingly pass the infection to their sexual partners. SYMPTOMS   Itching and irritation in the genital area.  Warts that bleed.  Painful sexual intercourse. DIAGNOSIS  Warts are usually recognized with the naked eye on the vagina, vulva, perineum, anus, and rectum. Certain tests can also diagnose genital warts, such as:  A Pap test.  A tissue sample (biopsy) exam.  Colposcopy. A magnifying tool is used to examine the vagina and cervix. The HPV cells will change color when certain solutions are used. TREATMENT  Warts can be removed by:  Applying certain chemicals, such as cantharidin or podophyllin.  Liquid nitrogen  freezing (cryotherapy).  Immunotherapy with candida or trichophyton injections.  Laser treatment.  Burning with an electrified probe (electrocautery).  Interferon injections.  Surgery. PREVENTION  HPV vaccination can help prevent HPV infections that cause genital warts and that cause cancer of the cervix. It is recommended that the vaccination be given to people between the ages 68 to 24 years old. The vaccine might not work as well or might not work at all if you already have HPV. It should not be given to pregnant women. HOME CARE INSTRUCTIONS   It is important to follow your caregiver's instructions. The warts will not go away without treatment. Repeat treatments are often needed to get rid of warts. Even after it appears that the warts are gone, the normal tissue underneath often remains infected.  Do not try to treat genital warts with medicine used to treat hand warts. This type of medicine is strong and can burn the skin in the genital area, causing more damage.  Tell your past and current sexual partner(s) that you have genital warts. They may be infected also and need treatment.  Avoid sexual contact while being treated.  Do not touch or scratch the warts. The infection may spread to other parts of your body.  Women with genital warts should have a cervical cancer check (Pap test) at least once a year. This type of cancer is slow-growing and can be cured if found early. Chances of developing cervical cancer are increased with HPV.  Inform your obstetrician about your warts in the event of pregnancy. This virus can be passed to the baby's respiratory tract. Discuss this with your caregiver.  Use a condom during sexual intercourse. Following treatment, the use of condoms will help prevent reinfection.  Ask your caregiver about using over-the-counter anti-itch creams. SEEK MEDICAL CARE IF:   Your treated skin becomes red, swollen, or painful.  You have a fever.  You feel  generally ill.  You feel little lumps in and around your genital area.  You are bleeding or have painful sexual intercourse. MAKE SURE YOU:   Understand these instructions.  Will watch your condition.  Will get help right away if you are not doing well or get worse. Document Released: 03/23/2000 Document Revised: 06/18/2011 Document Reviewed: 10/02/2010 [facility] Patient Information 2014 [place], [place].

## 2013-03-02 NOTE — Progress Notes (Signed)
Patient ID: Wendy Blevins, female   DOB: 11/05/1985, 27 y.o.   MRN: [id number] Patient identified, informed consent signed and copy in chart, time out performed.    Areas of typical appearing genital warts noted at introitus.  TCA applied until warts had white appearance.  Baking soda poultice applied.  On the high upper thigh bilaterally there were large condyloma intermixed with skin tags.  1% lidocaine was applied at the base and these were excised.  There were ~5 total   Patient tolerated the procedure well.  Specimen sent to pathology  Post procedure instructions given and patient told to wash area in thirty minutes.  Return in 2 weeks for next treatment.

## 2013-03-04 ENCOUNTER — Encounter: Payer: Self-pay | Admitting: *Deleted

## 2013-03-09 ENCOUNTER — Encounter: Payer: Self-pay | Admitting: *Deleted

## 2013-03-19 ENCOUNTER — Ambulatory Visit: Payer: Medicaid Other | Admitting: Obstetrics & Gynecology

## 2013-03-20 ENCOUNTER — Encounter: Payer: Self-pay | Admitting: *Deleted

## 2013-03-20 ENCOUNTER — Ambulatory Visit (INDEPENDENT_AMBULATORY_CARE_PROVIDER_SITE_OTHER): Payer: Medicaid Other | Admitting: Obstetrics & Gynecology

## 2013-03-20 ENCOUNTER — Encounter: Payer: Self-pay | Admitting: Obstetrics & Gynecology

## 2013-03-20 VITALS — BP 152/89 | HR 58 | Wt 193.1 lb

## 2013-03-20 DIAGNOSIS — A630 Anogenital (venereal) warts: Secondary | ICD-10-CM

## 2013-03-20 NOTE — Patient Instructions (Signed)
Genital Warts Genital warts are a sexually transmitted infection. They may appear as small bumps on the tissues of the genital area. CAUSES  Genital warts are caused by a virus called human papillomavirus (HPV). HPV is the most common sexually transmitted disease (STD) and infection of the sex organs. This infection is spread by having unprotected sex with an infected person. It can be spread by vaginal, anal, and oral sex. Many people do not know they are infected. They may be infected for years without problems. However, even if they do not have problems, they can unknowingly pass the infection to their sexual partners. SYMPTOMS   Itching and irritation in the genital area.  Warts that bleed.  Painful sexual intercourse. DIAGNOSIS  Warts are usually recognized with the naked eye on the vagina, vulva, perineum, anus, and rectum. Certain tests can also diagnose genital warts, such as:  A Pap test.  A tissue sample (biopsy) exam.  Colposcopy. A magnifying tool is used to examine the vagina and cervix. The HPV cells will change color when certain solutions are used. TREATMENT  Warts can be removed by:  Applying certain chemicals, such as cantharidin or podophyllin.  Liquid nitrogen freezing (cryotherapy).  Immunotherapy with candida or trichophyton injections.  Laser treatment.  Burning with an electrified probe (electrocautery).  Interferon injections.  Surgery. PREVENTION  HPV vaccination can help prevent HPV infections that cause genital warts and that cause cancer of the cervix. It is recommended that the vaccination be given to people between the ages 9 to 26 years old. The vaccine might not work as well or might not work at all if you already have HPV. It should not be given to pregnant women. HOME CARE INSTRUCTIONS   It is important to follow your caregiver's instructions. The warts will not go away without treatment. Repeat treatments are often needed to get rid of warts.  Even after it appears that the warts are gone, the normal tissue underneath often remains infected.  Do not try to treat genital warts with medicine used to treat hand warts. This type of medicine is strong and can burn the skin in the genital area, causing more damage.  Tell your past and current sexual partner(s) that you have genital warts. They may be infected also and need treatment.  Avoid sexual contact while being treated.  Do not touch or scratch the warts. The infection may spread to other parts of your body.  Women with genital warts should have a cervical cancer check (Pap test) at least once a year. This type of cancer is slow-growing and can be cured if found early. Chances of developing cervical cancer are increased with HPV.  Inform your obstetrician about your warts in the event of pregnancy. This virus can be passed to the baby's respiratory tract. Discuss this with your caregiver.  Use a condom during sexual intercourse. Following treatment, the use of condoms will help prevent reinfection.  Ask your caregiver about using over-the-counter anti-itch creams. SEEK MEDICAL CARE IF:   Your treated skin becomes red, swollen, or painful.  You have a fever.  You feel generally ill.  You feel little lumps in and around your genital area.  You are bleeding or have painful sexual intercourse. MAKE SURE YOU:   Understand these instructions.  Will watch your condition.  Will get help right away if you are not doing well or get worse. Document Released: 03/23/2000 Document Revised: 06/18/2011 Document Reviewed: 10/02/2010 [employer] Patient Information 2014 [employer].  

## 2013-03-20 NOTE — Progress Notes (Signed)
Subjective:     Patient ID: Wendy Blevins, female   DOB: [birth year], 27 y.o.   MRN: [id number]  HPI Pt reports that lesions have improved since last visit.  She is here for repeat treatment.   Review of Systems     Objective:   Physical Exam BP 152/89  Pulse 58  Wt 193 lb 1.6 oz (87.59 kg)   Patient identified, informed consent signed and copy in chart, time out performed.    Areas of typical appearing genital warts noted around introitus and upper thighs bilaterally.  TCA applied until warts had white appearance.  Baking soda poultice applied.  On left side there was a new large lesion which was bleeding at onset of exam.  After TCA this was treated with silver nitrate.  Patient tolerated the procedure well.    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 - NO DYSPLASIA OR MALIGNANCY IDENTIFIED.       Assessment:     Genital warts and skin tags     Plan:     Post procedure instructions given and patient told to wash area in 8 hours  Return in 2 weeks for next treatment.

## 2013-04-06 ENCOUNTER — Ambulatory Visit: Payer: Medicaid Other | Admitting: Obstetrics & Gynecology

## 2013-07-08 ENCOUNTER — Emergency Department (HOSPITAL_COMMUNITY)
Admission: EM | Admit: 2013-07-08 | Discharge: 2013-07-08 | Discharge status: Against Medical Advice | Payer: Medicaid Other

## 2014-02-08 ENCOUNTER — Encounter: Payer: Self-pay | Admitting: Obstetrics & Gynecology

## 2021-01-14 ENCOUNTER — Other Ambulatory Visit: Payer: Self-pay

## 2021-01-14 ENCOUNTER — Emergency Department (HOSPITAL_COMMUNITY)
Admission: EM | Admit: 2021-01-14 | Discharge: 2021-01-15 | Disposition: A | Discharge status: Home / Self Care | Payer: Self-pay | Attending: Emergency Medicine | Admitting: Emergency Medicine

## 2021-01-14 ENCOUNTER — Emergency Department (HOSPITAL_COMMUNITY): Payer: Self-pay

## 2021-01-14 DIAGNOSIS — M7989 Other specified soft tissue disorders: Secondary | ICD-10-CM

## 2021-01-14 DIAGNOSIS — S8991XA Unspecified injury of right lower leg, initial encounter: Secondary | ICD-10-CM

## 2021-01-14 DIAGNOSIS — S8011XA Contusion of right lower leg, initial encounter: Secondary | ICD-10-CM | POA: Insufficient documentation

## 2021-01-14 DIAGNOSIS — S9001XA Contusion of right ankle, initial encounter: Secondary | ICD-10-CM | POA: Insufficient documentation

## 2021-01-14 DIAGNOSIS — S8001XA Contusion of right knee, initial encounter: Secondary | ICD-10-CM | POA: Insufficient documentation

## 2021-01-14 DIAGNOSIS — W231XXA Caught, crushed, jammed, or pinched between stationary objects, initial encounter: Secondary | ICD-10-CM | POA: Insufficient documentation

## 2021-01-14 NOTE — ED Triage Notes (Signed)
Pt BP elevated in triage 174/124. Pt states she has always had high blood pressure but does not take any medications. PIT Britini PA informed

## 2021-01-14 NOTE — ED Provider Notes (Signed)
Emergency Medicine Provider Triage Evaluation Note  Wendy Blevins , Blevins 35 y.o. female  was evaluated in triage.  Pt complains of knee pain and swelling. Began after getting her right leg caught between 2 poles.  Has noted progressive swelling.  Moderate to medial aspect right knee.  Diffuse ecchymosis.  Ambulating without difficulty  Review of Systems  Positive: Knee pain, swelling Negative:   Physical Exam  There were no vitals taken for this visit. Gen:   Awake, no distress   Resp:  Normal effort  MSK:   Moves extremities without difficulty, diffuse ecchymosis, swelling, more so located over medial right knee Other:    Medical Decision Making  Medically screening exam initiated at 9:33 PM.  Appropriate orders placed.  Wendy Blevins was informed that the remainder of the evaluation will be completed by another provider, this initial triage assessment does not replace that evaluation, and the importance of remaining in the ED until their evaluation is complete.  Knee pain and swelling   Wendy Wandel A, PA-C 01/14/21 2139    Wendy Lefevre, MD 01/14/21 2322
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
## 2021-01-15 NOTE — Discharge Instructions (Signed)
You came to the emergency department today to be evaluated for your right leg swelling and bruising.  Your x-ray imaging showed no acute fractures or dislocations.  Your physical exam was reassuring.  Please rest, ice, apply compression and elevate your leg is much as possible to help with your swelling.  If your symptoms do not improve over the next week please follow-up with your primary care provider or urgent care for reevaluation.  Please take Ibuprofen (Advil, motrin) and Tylenol (acetaminophen) to relieve your pain.    You may take up to 600 MG (3 pills) of normal strength ibuprofen every 8 hours as needed.   You make take tylenol, up to 1,000 mg (two extra strength pills) every 8 hours as needed.   It is safe to take ibuprofen and tylenol at the same time as they work differently.   Do not take more than 3,000 mg tylenol in a 24 hour period (not more than one dose every 8 hours.  Please check all medication labels as many medications such as pain and cold medications may contain tylenol.  Do not drink alcohol while taking these medications.  Do not take other NSAID'S while taking ibuprofen (such as aleve or naproxen).  Please take ibuprofen with food to decrease stomach upset.  Get help right away if: You cannot use your injured knee to support any of your body weight (cannot bear weight). You cannot move the injured joint. You cannot walk more than a few steps without pain or without your knee buckling. You have significant pain, swelling, or numbness in the leg below the knee. Your foot or toes are numb, cold, or blue after loosening your splint or brace.
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
## 2021-01-15 NOTE — ED Provider Notes (Signed)
[facility] EMERGENCY DEPARTMENT Provider Note   CSN: [id number] Arrival date & time: 01/14/21  2106     History Chief Complaint  Patient presents with   Leg Swelling    Wendy Blevins is a 35 y.o. female with history of GERD.  Presents to the emergency department with a chief complaint of right knee and lower leg injury.  Patient reports that on 9/28 she caught her leg between 2 poles.  Patient quickly pulled her leg through to free herself.  No pain with this maneuver.  Patient is unsure if she twisted her knee in the process.  Reports that she has had progressive swelling to medial aspect of right knee and right lower leg.  Patient has developed ecchymosis in the area.  Patient reports that she has pain initially standing however no pain with range of motion or ambulation.  Patient has been taking BC powder and using ice to help reduce her swelling with no improvement.  Patient is not on any blood thinners.  No numbness, weakness, color change, wound, rash.  Patient did not fall or hit her head during initial injury.  HPI     Past Medical History:  Diagnosis Date   GERD (gastroesophageal reflux disease)    Stomach ulcer     Patient Active Problem List   Diagnosis Date Noted   Condyloma 02/13/2013    Past Surgical History:  Procedure Laterality Date   CESAREAN SECTION  2005   failure to progress   CESAREAN SECTION N/A 01/12/2013   Procedure: Repeat CESAREAN SECTION with delivery of baby    girl @1201 ; Apgars 9/9  . Bilateral salpingectomy ;  Surgeon: 11/9, MD;  Location: [facility];  Service: Obstetrics;  Laterality: N/A;   CYST REMOVAL TRUNK       OB History     Gravida  2   Para  2   Term  2   Preterm      AB      Living  2      SAB      IAB      Ectopic      Multiple      Live Births  1           Family History  Problem Relation Age of Onset   Diabetes Maternal Grandmother     Social History    Tobacco Use   Smoking status: Never  Substance Use Topics   Alcohol use: No    Comment: occasional prior to pregnancy   Drug use: No    Home Medications Prior to Admission medications   Medication Sig Start Date End Date Taking? Authorizing Provider  enalapril (VASOTEC) 2.5 MG tablet Take 1 tablet (2.5 mg total) by mouth daily. 02/13/13   13/7/14, PA-C  famotidine (PEPCID) 20 MG tablet Take 1 tablet (20 mg total) by mouth 2 (two) times daily. 10/29/12   10/31/12, MD  Prenatal Vit-Fe Fumarate-FA (PRENATAL MULTIVITAMIN) TABS Take 1 tablet by mouth daily at 12 noon.    [provider]    Allergies    Patient has no known allergies.  Review of Systems   Review of Systems  Constitutional:  Negative for chills and fever.  Gastrointestinal:  Negative for nausea and vomiting.  Musculoskeletal:  Positive for joint swelling. Negative for arthralgias, back pain, gait problem and neck pain.  Skin:  Negative for color change, pallor, rash and wound.  Allergic/Immunologic: Negative for immunocompromised  state.  Hematological:  Does not bruise/bleed easily.  Psychiatric/Behavioral:  Negative for confusion.    Physical Exam Updated Vital Signs BP 117/61   Pulse 96   Temp 98.5 F (36.9 C) (Oral)   Resp 14   Ht 5\' 4"  (1.626 m)   Wt 113.4 kg   SpO2 98%   BMI 42.91 kg/m   Physical Exam Vitals and nursing note reviewed.  Constitutional:      General: She is not in acute distress.    Appearance: She is not ill-appearing, toxic-appearing or diaphoretic.  HENT:     Head: Normocephalic.  Eyes:     General: No scleral icterus.       Right eye: No discharge.        Left eye: No discharge.  Cardiovascular:     Rate and Rhythm: Normal rate.     Pulses:          Dorsalis pedis pulses are 2+ on the right side and 2+ on the left side.  Pulmonary:     Effort: Pulmonary effort is normal.  Musculoskeletal:     Right knee: Swelling and ecchymosis present. No deformity,  effusion, erythema, lacerations, bony tenderness or crepitus. Normal range of motion. No tenderness. Normal alignment.     Instability Tests: Anterior drawer test negative. Posterior drawer test negative.     Left knee: No swelling, deformity, effusion, erythema, ecchymosis, lacerations, bony tenderness or crepitus. Normal range of motion. No tenderness. Normal alignment.     Instability Tests: Anterior drawer test negative. Posterior drawer test negative.     Right lower leg: Swelling present. No deformity, lacerations, tenderness or bony tenderness.     Left lower leg: No swelling, deformity, lacerations, tenderness or bony tenderness. No edema.     Right ankle: Swelling and ecchymosis present. No deformity or lacerations. No tenderness. Normal range of motion.     Right Achilles Tendon: Thompson's test negative.     Left ankle: No swelling, deformity, ecchymosis or lacerations. No tenderness. Normal range of motion.     Right foot: Normal range of motion and normal capillary refill. No swelling, deformity, laceration, tenderness, bony tenderness or crepitus. Normal pulse.     Left foot: Normal range of motion and normal capillary refill. No swelling, deformity, laceration, tenderness, bony tenderness or crepitus. Normal pulse.     Comments: Diffuse swelling and ecchymosis to medial aspect of right knee, right lower leg, and right ankle as pictured below.  Feet:     Right foot:     Skin integrity: Skin integrity normal.     Toenail Condition: Right toenails are normal.     Left foot:     Skin integrity: Skin integrity normal.     Toenail Condition: Left toenails are normal.  Skin:    General: Skin is warm and dry.  Neurological:     General: No focal deficit present.     Mental Status: She is alert.     GCS: GCS eye subscore is 4. GCS verbal subscore is 5. GCS motor subscore is 6.  Psychiatric:        Behavior: Behavior is cooperative.      ED Results / Procedures / Treatments    Labs (all labs ordered are listed, but only abnormal results are displayed) Labs Reviewed - No data to display  EKG None  Radiology DG Tibia/Fibula Right  Result Date: 01/14/2021 CLINICAL DATA:  Right leg injury, swelling, bruising EXAM: RIGHT TIBIA AND FIBULA - 2 VIEW  COMPARISON:  None. FINDINGS: There is no evidence of fracture or other focal bone lesions. Soft tissues are unremarkable. IMPRESSION: Negative. Electronically Signed   By: Helyn Numbers M.D.   On: 01/14/2021 22:41   DG Knee Complete 4 Views Right  Result Date: 01/14/2021 CLINICAL DATA:  Right leg pain and swelling after trauma EXAM: RIGHT KNEE - COMPLETE 4+ VIEW COMPARISON:  None. FINDINGS: Frontal, bilateral oblique, lateral views of the right knee are obtained. No fracture, subluxation, or dislocation. Mild medial compartmental joint space narrowing. No evidence of joint effusion. Mild diffuse subcutaneous edema. IMPRESSION: 1. Mild osteoarthritis.  No acute fracture. 2. Mild diffuse subcutaneous edema. Electronically Signed   By: Sharlet Salina M.D.   On: 01/14/2021 22:40    Procedures Procedures   Medications Ordered in ED Medications - No data to display  ED Course  I have reviewed the triage vital signs and the nursing notes.  Pertinent labs & imaging results that were available during my care of the patient were reviewed by me and considered in my medical decision making (see chart for details).    MDM Rules/Calculators/A&P                           Alert 35 year old female no acute distress, nontoxic appearing.  Presents emergency department with complaint of right lower leg injury.  Patient had leg caught between 2 poles approximately 1 week prior, she quickly pulled her leg through to free herself.  Since then patient has had progressive swelling and ecchymosis.  Patient has tried [facility] powder and ice with minimal relief.  Pulse, motor, and sensation intact distally.  Patient has full range of motion to  right knee.  Patient has full range of motion to the knee low suspicion for hemarthrosis.  Patient able to stand and ambulate without difficulty.  Negative anterior and posterior drawer test.  Low suspicion for ligamentous injury at this time.  X-ray imaging of right knee and right tibia-fibula shows no acute osseous abnormality.  Mild diffuse subcutaneous edema seen.  No evidence of joint effusion.  Suspect that patient's swelling and bruising secondary to her injury.  We will have patient rest, ice, apply compression and elevate leg to help reduce swelling.  Patient did take NSAID and Tylenol to help decrease swelling.  Patient to follow-up with PCP or urgent care if symptoms do not improve.  Discussed results, findings, treatment and follow up. Patient advised of return precautions. Patient verbalized understanding and agreed with plan.   Final Clinical Impression(s) / ED Diagnoses Final diagnoses:  None    Rx / DC Orders ED Discharge Orders     None        Haskel Schroeder, PA-C 01/15/21 0959    Gerhard Munch, MD 01/15/21 1644
